# Patient Record
Sex: Female | Born: 1970 | Race: Black or African American | Hispanic: No | Smoking: Former smoker
Health system: Southern US, Community
[De-identification: ages and names within clinical notes are randomized; demographics above are authoritative.]

## PROBLEM LIST (undated history)

## (undated) DIAGNOSIS — M1990 Unspecified osteoarthritis, unspecified site: Secondary | ICD-10-CM

## (undated) DIAGNOSIS — I10 Essential (primary) hypertension: Secondary | ICD-10-CM

## (undated) DIAGNOSIS — U071 COVID-19: Secondary | ICD-10-CM

## (undated) DIAGNOSIS — G8929 Other chronic pain: Secondary | ICD-10-CM

## (undated) HISTORY — PX: ABDOMINAL HYSTERECTOMY: SHX81

---

## 2017-04-26 ENCOUNTER — Encounter (HOSPITAL_BASED_OUTPATIENT_CLINIC_OR_DEPARTMENT_OTHER): Payer: Self-pay | Admitting: Emergency Medicine

## 2017-04-26 ENCOUNTER — Emergency Department (HOSPITAL_BASED_OUTPATIENT_CLINIC_OR_DEPARTMENT_OTHER): Payer: Medicaid Other

## 2017-04-26 ENCOUNTER — Emergency Department (HOSPITAL_BASED_OUTPATIENT_CLINIC_OR_DEPARTMENT_OTHER)
Admission: EM | Admit: 2017-04-26 | Discharge: 2017-04-26 | Disposition: A | Discharge status: Home / Self Care | Payer: Medicaid Other | Attending: Emergency Medicine | Admitting: Emergency Medicine

## 2017-04-26 DIAGNOSIS — F17200 Nicotine dependence, unspecified, uncomplicated: Secondary | ICD-10-CM | POA: Diagnosis not present

## 2017-04-26 DIAGNOSIS — K5909 Other constipation: Secondary | ICD-10-CM | POA: Insufficient documentation

## 2017-04-26 DIAGNOSIS — Z79899 Other long term (current) drug therapy: Secondary | ICD-10-CM | POA: Insufficient documentation

## 2017-04-26 DIAGNOSIS — I10 Essential (primary) hypertension: Secondary | ICD-10-CM | POA: Diagnosis not present

## 2017-04-26 DIAGNOSIS — R109 Unspecified abdominal pain: Secondary | ICD-10-CM | POA: Diagnosis present

## 2017-04-26 HISTORY — DX: Essential (primary) hypertension: I10

## 2017-04-26 LAB — COMPREHENSIVE METABOLIC PANEL
ALT: 20 U/L (ref 14–54)
ANION GAP: 8 (ref 5–15)
AST: 22 U/L (ref 15–41)
Albumin: 3.8 g/dL (ref 3.5–5.0)
Alkaline Phosphatase: 55 U/L (ref 38–126)
BUN: 22 mg/dL — ABNORMAL HIGH (ref 6–20)
CHLORIDE: 98 mmol/L — AB (ref 101–111)
CO2: 30 mmol/L (ref 22–32)
Calcium: 9 mg/dL (ref 8.9–10.3)
Creatinine, Ser: 0.82 mg/dL (ref 0.44–1.00)
Glucose, Bld: 95 mg/dL (ref 65–99)
POTASSIUM: 4.1 mmol/L (ref 3.5–5.1)
Sodium: 136 mmol/L (ref 135–145)
Total Bilirubin: 0.4 mg/dL (ref 0.3–1.2)
Total Protein: 7.1 g/dL (ref 6.5–8.1)

## 2017-04-26 LAB — CBC WITH DIFFERENTIAL/PLATELET
BASOS ABS: 0 10*3/uL (ref 0.0–0.1)
Basophils Relative: 0 %
EOS PCT: 1 %
Eosinophils Absolute: 0 10*3/uL (ref 0.0–0.7)
HCT: 38.4 % (ref 36.0–46.0)
Hemoglobin: 13.1 g/dL (ref 12.0–15.0)
LYMPHS ABS: 1.9 10*3/uL (ref 0.7–4.0)
LYMPHS PCT: 34 %
MCH: 30.8 pg (ref 26.0–34.0)
MCHC: 34.1 g/dL (ref 30.0–36.0)
MCV: 90.4 fL (ref 78.0–100.0)
MONO ABS: 0.4 10*3/uL (ref 0.1–1.0)
Monocytes Relative: 7 %
Neutro Abs: 3.3 10*3/uL (ref 1.7–7.7)
Neutrophils Relative %: 58 %
PLATELETS: 259 10*3/uL (ref 150–400)
RBC: 4.25 MIL/uL (ref 3.87–5.11)
RDW: 13.6 % (ref 11.5–15.5)
WBC: 5.6 10*3/uL (ref 4.0–10.5)

## 2017-04-26 LAB — URINALYSIS, ROUTINE W REFLEX MICROSCOPIC
Bilirubin Urine: NEGATIVE
Glucose, UA: NEGATIVE mg/dL
Ketones, ur: NEGATIVE mg/dL
Leukocytes, UA: NEGATIVE
Nitrite: NEGATIVE
PROTEIN: NEGATIVE mg/dL
Specific Gravity, Urine: 1.008 (ref 1.005–1.030)
pH: 6 (ref 5.0–8.0)

## 2017-04-26 LAB — URINALYSIS, MICROSCOPIC (REFLEX)

## 2017-04-26 MED ORDER — POLYETHYLENE GLYCOL 3350 17 G PO PACK: 17.0000 g | PACK | Freq: Two times a day (BID) | ORAL | 0 refills | Status: DC

## 2017-04-26 MED ORDER — FLEET ENEMA 7-19 GM/118ML RE ENEM
1.0000 | ENEMA | Freq: Once | RECTAL | Status: AC
  Administered 2017-04-26: 1 via RECTAL
  Filled 2017-04-26: qty 1

## 2017-04-26 NOTE — ED Triage Notes (Signed)
LLQ abd pain radiating to the back x 3 days. States she was seen at [facility] yesterday with neg CT and told to f/u with PCP. States PCP cannot see her today and pain continues. Denies urinary symptoms, denies N/V

## 2017-04-26 NOTE — ED Provider Notes (Signed)
[facility] Provider Note   CSN: [id number] Arrival date & time: 04/26/17  1129     History   Chief Complaint Chief Complaint  Patient presents with  . Abdominal Pain  . Back Pain    HPI Theresa Miller is a 46 y.o. female history of hypertension, here presenting with abdominal pain. Patient had him left flank pain and left lower quadrant pain for the last several days. [facility] yesterday and was found to have some hematuria. He also had unremarkable labs and CT renal stone study that showed large constipation with no obvious kidney stone. Patient was thought to have muscle strain and was sent home. She states that her last bowel movement was about a week ago and she had more left upper quadrant pain so she is here for second opinion. She denies any vaginal discharge. Denies any fever or vomiting.       The history is provided by the patient.    Past Medical History:  Diagnosis Date  . Hypertension     There are no active problems to display for this patient.   Past Surgical History:  Procedure Laterality Date  . ABDOMINAL HYSTERECTOMY      OB History    No data available       Home Medications    Prior to Admission medications   Medication Sig Start Date End Date Taking? Authorizing Provider  lisinopril (PRINIVIL,ZESTRIL) 10 MG tablet Take 10 mg by mouth daily.   Yes [provider]  polyethylene glycol (MIRALAX / GLYCOLAX) packet Take 17 g by mouth 2 (two) times daily. 04/26/17   Charlynne PanderYao, Gaylan Fauver Hsienta, MD    Family History No family history on file.  Social History Social History  Substance Use Topics  . Smoking status: Current Every Day Smoker  . Smokeless tobacco: Never Used  . Alcohol use No     Allergies   Patient has no known allergies.   Review of Systems Review of Systems  Gastrointestinal: Positive for abdominal pain.  Musculoskeletal: Positive for back pain.  All other systems reviewed and are  negative.    Physical Exam Updated Vital Signs BP (!) 118/92 (BP Location: Left Arm)   Pulse 75   Temp 97.6 F (36.4 C) (Oral)   Resp 18   Ht 5\' 4"  (1.626 m)   Wt 83.5 kg (184 lb)   SpO2 100%   BMI 31.58 kg/m   Physical Exam  Constitutional: She is oriented to person, place, and time. She appears well-developed.  HENT:  Head: Normocephalic.  Mouth/Throat: Oropharynx is clear and moist.  Eyes: Conjunctivae and EOM are normal. Pupils are equal, round, and reactive to light.  Neck: Normal range of motion. Neck supple.  Cardiovascular: Normal rate, regular rhythm and normal heart sounds.   Pulmonary/Chest: Effort normal and breath sounds normal. No respiratory distress. She has no wheezes.  Abdominal: Soft. Bowel sounds are normal.  Mild LLQ tenderness, no rebound. No obvious CVAT   Musculoskeletal: Normal range of motion. She exhibits no edema.  Neurological: She is alert and oriented to person, place, and time. No cranial nerve deficit. Coordination normal.  Skin: Skin is warm.  Psychiatric: She has a normal mood and affect.  Nursing note and vitals reviewed.    ED Treatments / Results  Labs (all labs ordered are listed, but only abnormal results are displayed) Labs Reviewed  URINALYSIS, ROUTINE W REFLEX MICROSCOPIC - Abnormal; Notable for the following:  Result Value   Color, Urine YELLOW (*)    Hgb urine dipstick SMALL (*)    All other components within normal limits  COMPREHENSIVE METABOLIC PANEL - Abnormal; Notable for the following:    Chloride 98 (*)    BUN 22 (*)    All other components within normal limits  URINALYSIS, MICROSCOPIC (REFLEX) - Abnormal; Notable for the following:    Bacteria, UA RARE (*)    Squamous Epithelial / LPF 0-5 (*)    All other components within normal limits  CBC WITH DIFFERENTIAL/PLATELET    EKG  EKG Interpretation None       Radiology Dg Abd Acute W/chest  Result Date: 04/26/2017 CLINICAL DATA:  Abdominal pain and  low back pain for 3 days. EXAM: DG ABDOMEN ACUTE W/ 1V CHEST COMPARISON:  None. FINDINGS: Frontal view of the chest shows midline trachea and normal heart size. Lungs are clear. No pleural fluid. Two views of the abdomen show stool throughout the colon. No small bowel dilatation. No unexpected radiopaque calculi. Phleboliths are seen in the anatomic pelvis. IMPRESSION: 1. Bowel gas pattern is indicative of constipation. 2. Negative chest. Electronically Signed   By: Leanna Battles M.D.   On: 04/26/2017 12:28    Procedures Procedures (including critical care time)  Medications Ordered in ED Medications  sodium phosphate (FLEET) 7-19 GM/118ML enema 1 enema (1 enema Rectal Given 04/26/17 1254)     Initial Impression / Assessment and Plan / ED Course  I have reviewed the triage vital signs and the nursing notes.  Pertinent labs & imaging results that were available during my care of the patient were reviewed by me and considered in my medical decision making (see chart for details).     Theresa Miller is a 46 y.o. female here with constipation, LLQ pain. CT yesterday showed constipation and patient had no BM for a week. Repeat UA showed some blood but no obvious UTI. Repeat labs unremarkable, xray showed constipation. Given enema and had a large bowel movement and pain improved. Will dc home with miralax for constipation. I doubt torsion or GYN causes of her pain    Final Clinical Impressions(s) / ED Diagnoses   Final diagnoses:  Other constipation    New Prescriptions New Prescriptions   POLYETHYLENE GLYCOL (MIRALAX / GLYCOLAX) PACKET    Take 17 g by mouth 2 (two) times daily.     Charlynne Pander, MD 04/26/17 [phone]

## 2017-04-26 NOTE — Discharge Instructions (Signed)
Take miralax twice daily until your stool is soft for a week.   Stay hydrated,   Take tylenol, motrin for abdominal pain.   See your doctor   Return to ER if you have severe abdominal pain, vomiting, fever

## 2017-04-26 NOTE — ED Notes (Signed)
Pt reports successful BM

## 2019-01-07 ENCOUNTER — Encounter (HOSPITAL_BASED_OUTPATIENT_CLINIC_OR_DEPARTMENT_OTHER): Payer: Self-pay | Admitting: Emergency Medicine

## 2019-01-07 ENCOUNTER — Other Ambulatory Visit: Payer: Self-pay

## 2019-01-07 ENCOUNTER — Emergency Department (HOSPITAL_BASED_OUTPATIENT_CLINIC_OR_DEPARTMENT_OTHER)
Admission: EM | Admit: 2019-01-07 | Discharge: 2019-01-07 | Disposition: A | Discharge status: Home / Self Care | Payer: Medicaid Other | Attending: Emergency Medicine | Admitting: Emergency Medicine

## 2019-01-07 ENCOUNTER — Emergency Department (HOSPITAL_BASED_OUTPATIENT_CLINIC_OR_DEPARTMENT_OTHER): Payer: Medicaid Other

## 2019-01-07 DIAGNOSIS — R110 Nausea: Secondary | ICD-10-CM | POA: Diagnosis not present

## 2019-01-07 DIAGNOSIS — R898 Other abnormal findings in specimens from other organs, systems and tissues: Secondary | ICD-10-CM | POA: Diagnosis not present

## 2019-01-07 DIAGNOSIS — F17200 Nicotine dependence, unspecified, uncomplicated: Secondary | ICD-10-CM | POA: Diagnosis not present

## 2019-01-07 DIAGNOSIS — N951 Menopausal and female climacteric states: Secondary | ICD-10-CM | POA: Insufficient documentation

## 2019-01-07 DIAGNOSIS — I10 Essential (primary) hypertension: Secondary | ICD-10-CM | POA: Diagnosis not present

## 2019-01-07 DIAGNOSIS — Z79899 Other long term (current) drug therapy: Secondary | ICD-10-CM | POA: Diagnosis not present

## 2019-01-07 DIAGNOSIS — R0602 Shortness of breath: Secondary | ICD-10-CM | POA: Diagnosis not present

## 2019-01-07 LAB — CBC WITH DIFFERENTIAL/PLATELET
Abs Immature Granulocytes: 0.04 10*3/uL (ref 0.00–0.07)
BASOS PCT: 0 %
Basophils Absolute: 0 10*3/uL (ref 0.0–0.1)
EOS ABS: 0 10*3/uL (ref 0.0–0.5)
EOS PCT: 1 %
HEMATOCRIT: 42.3 % (ref 36.0–46.0)
Hemoglobin: 13.3 g/dL (ref 12.0–15.0)
IMMATURE GRANULOCYTES: 1 %
LYMPHS ABS: 3 10*3/uL (ref 0.7–4.0)
Lymphocytes Relative: 34 %
MCH: 28.5 pg (ref 26.0–34.0)
MCHC: 31.4 g/dL (ref 30.0–36.0)
MCV: 90.8 fL (ref 80.0–100.0)
MONOS PCT: 8 %
Monocytes Absolute: 0.7 10*3/uL (ref 0.1–1.0)
NEUTROS PCT: 56 %
Neutro Abs: 4.9 10*3/uL (ref 1.7–7.7)
PLATELETS: 328 10*3/uL (ref 150–400)
RBC: 4.66 MIL/uL (ref 3.87–5.11)
RDW: 14 % (ref 11.5–15.5)
WBC: 8.6 10*3/uL (ref 4.0–10.5)
nRBC: 0 % (ref 0.0–0.2)

## 2019-01-07 LAB — BASIC METABOLIC PANEL
ANION GAP: 11 (ref 5–15)
BUN: 24 mg/dL — AB (ref 6–20)
CO2: 24 mmol/L (ref 22–32)
Calcium: 9 mg/dL (ref 8.9–10.3)
Chloride: 101 mmol/L (ref 98–111)
Creatinine, Ser: 0.99 mg/dL (ref 0.44–1.00)
GFR calc Af Amer: >60 mL/min (ref >60)
GLUCOSE: 101 mg/dL — AB (ref 70–99)
POTASSIUM: 3.4 mmol/L — AB (ref 3.5–5.1)
SODIUM: 136 mmol/L (ref 135–145)

## 2019-01-07 LAB — TROPONIN I: Troponin I: <0.03 ng/mL (ref <0.03)

## 2019-01-07 LAB — D-DIMER, QUANTITATIVE: D-Dimer, Quant: <0.27 ug/mL-FEU (ref 0.00–0.50)

## 2019-01-07 NOTE — ED Provider Notes (Signed)
[facility] EMERGENCY DEPARTMENT Provider Note   CSN: [id number] Arrival date & time: 01/07/19  1929    History   Chief Complaint Chief Complaint  Patient presents with  . Abnormal Lab    pt sent by pcp for "abnormal test" - doesn't know what it is  . Shortness of Breath    HPI Theresa Miller is a 48 y.o. female.     The history is provided by the patient and medical records. No language interpreter was used.  Abnormal Lab  Shortness of Breath   Theresa Miller is a 48 y.o. female who presents to the Emergency Department complaining of sob. She presents to the emergency department for evaluation of shortness of breath. She has been short of breath for the last 3 to 4 weeks with significant dyspnea at rest as well as with exertion. She states that she feels like she needs to positive breathe and at times has to put on her cigarette. She has occasional associated nausea. She also complains of occasional hot flashes but she is in menopause. No fevers, cough, chest pain. She did have knee surgery in January of this year and does have some residual swelling in her leg. She has a history of hypertension. No additional medical problems. No hormone use. She has no history of DVT or PE and is not currently anticoagulated. She had an outpatient breathing test performed two weeks ago where she breathed into a device. She was contacted today and told that the test was abnormal and she needs to see her family doctor. She is unsure what the test was or what was abnormal about it. Past Medical History:  Diagnosis Date  . Hypertension     There are no active problems to display for this patient.   Past Surgical History:  Procedure Laterality Date  . ABDOMINAL HYSTERECTOMY       OB History   No obstetric history on file.      Home Medications    Prior to Admission medications   Medication Sig Start Date End Date Taking? Authorizing Provider  lisinopril (PRINIVIL,ZESTRIL) 10  MG tablet Take 10 mg by mouth daily.    [provider]  polyethylene glycol (MIRALAX / GLYCOLAX) packet Take 17 g by mouth 2 (two) times daily. 04/26/17   Charlynne Pander, MD    Family History History reviewed. No pertinent family history.  Social History Social History   Tobacco Use  . Smoking status: Current Every Day Smoker  . Smokeless tobacco: Never Used  Substance Use Topics  . Alcohol use: No  . Drug use: No     Allergies   Patient has no known allergies.   Review of Systems Review of Systems  Respiratory: Positive for shortness of breath.   All other systems reviewed and are negative.    Physical Exam Updated Vital Signs BP 117/88   Pulse 86   Temp 97.8 F (36.6 C) (Oral)   Resp 18   Ht 5\' 4"  (1.626 m)   Wt 102.1 kg   SpO2 99%   BMI 38.62 kg/m   Physical Exam Vitals signs and nursing note reviewed.  Constitutional:      Appearance: She is well-developed.  HENT:     Head: Normocephalic and atraumatic.  Cardiovascular:     Rate and Rhythm: Normal rate and regular rhythm.     Heart sounds: No murmur.  Pulmonary:     Effort: Pulmonary effort is normal. No respiratory distress.  Breath sounds: Normal breath sounds.  Abdominal:     Palpations: Abdomen is soft.     Tenderness: There is no abdominal tenderness. There is no guarding or rebound.  Musculoskeletal:        General: No swelling or tenderness.  Skin:    General: Skin is warm and dry.     Capillary Refill: Capillary refill takes less than 2 seconds.  Neurological:     Mental Status: She is alert and oriented to person, place, and time.  Psychiatric:        Behavior: Behavior normal.      ED Treatments / Results  Labs (all labs ordered are listed, but only abnormal results are displayed) Labs Reviewed  BASIC METABOLIC PANEL - Abnormal; Notable for the following components:      Result Value   Potassium 3.4 (*)    Glucose, Bld 101 (*)    BUN 24 (*)    All other  components within normal limits  TROPONIN I  CBC WITH DIFFERENTIAL/PLATELET  D-DIMER, QUANTITATIVE (NOT AT [facility])    EKG EKG Interpretation  Date/Time:  Saturday January 07 2019 19:44:45 EDT Ventricular Rate:  80 PR Interval:    QRS Duration: 82 QT Interval:  389 QTC Calculation: 449 R Axis:   42 Text Interpretation:  Sinus rhythm Borderline abnrm T, anterolateral leads no prior available for comparison Confirmed by Tilden Fossa [phone]) on 01/07/2019 7:50:46 PM   Radiology Dg Chest 2 View  Result Date: 01/07/2019 CLINICAL DATA:  Shortness of breath for several weeks EXAM: CHEST - 2 VIEW COMPARISON:  08/16/2017 FINDINGS: Cardiac shadows within normal limits. The lungs are well aerated bilaterally. No focal infiltrate or sizable effusion is seen. No bony abnormality is noted. IMPRESSION: No active cardiopulmonary disease. Electronically Signed   By: Alcide Clever M.D.   On: 01/07/2019 20:16    Procedures Procedures (including critical care time)  Medications Ordered in ED Medications - No data to display   Initial Impression / Assessment and Plan / ED Course  I have reviewed the triage vital signs and the nursing notes.  Pertinent labs & imaging results that were available during my care of the patient were reviewed by me and considered in my medical decision making (see chart for details).        Patient with history of tobacco abuse here for evaluation of lab abnormality in three weeks of shortness of breath. It is unclear what lab abnormality she had but feel it is likely it was a pulmonary function test. EKG with nonspecific changes and no priors available for comparison. She has no respiratory distress on examination with clear lungs bilaterally. She has no vital sign abnormalities. Chest x-ray without evidence of pneumonia. Doubt PE, D dimer is negative. Current presentation is not consistent with ACS, CHF, pneumonia. Discussed with patient home care for shortness of  breath. Discussed importance of outpatient follow-up and return precautions.  Final Clinical Impressions(s) / ED Diagnoses   Final diagnoses:  Shortness of breath    ED Discharge Orders    None       Tilden Fossa, MD 01/07/19 2124

## 2019-01-07 NOTE — ED Notes (Signed)
Pt reports having a breathing test at the pain clinic two weeks ago. States she received a call today to follow up with PCP. Unable to report what the test was for.

## 2019-01-07 NOTE — ED Notes (Signed)
ED Provider at bedside. 

## 2019-01-07 NOTE — ED Triage Notes (Signed)
Sent by PCP for an abnormal test that she isn't aware of - states she received a call by doctor to see her PCP on Monday but decided to come in to be seen. Still smoking a pack a day. SHOB for weeks - nothing new about it today.

## 2019-01-07 NOTE — ED Notes (Signed)
PT states understanding of care given, follow up care. PT ambulated from ED to car with a steady gait.  

## 2019-01-14 ENCOUNTER — Emergency Department (HOSPITAL_BASED_OUTPATIENT_CLINIC_OR_DEPARTMENT_OTHER)
Admission: EM | Admit: 2019-01-14 | Discharge: 2019-01-15 | Disposition: A | Discharge status: Home / Self Care | Payer: Medicaid Other | Attending: Emergency Medicine | Admitting: Emergency Medicine

## 2019-01-14 ENCOUNTER — Other Ambulatory Visit: Payer: Self-pay

## 2019-01-14 ENCOUNTER — Emergency Department (HOSPITAL_BASED_OUTPATIENT_CLINIC_OR_DEPARTMENT_OTHER): Payer: Medicaid Other

## 2019-01-14 ENCOUNTER — Encounter (HOSPITAL_BASED_OUTPATIENT_CLINIC_OR_DEPARTMENT_OTHER): Payer: Self-pay | Admitting: *Deleted

## 2019-01-14 DIAGNOSIS — R69 Illness, unspecified: Secondary | ICD-10-CM

## 2019-01-14 DIAGNOSIS — R0602 Shortness of breath: Secondary | ICD-10-CM | POA: Diagnosis present

## 2019-01-14 DIAGNOSIS — I10 Essential (primary) hypertension: Secondary | ICD-10-CM | POA: Diagnosis not present

## 2019-01-14 DIAGNOSIS — F17210 Nicotine dependence, cigarettes, uncomplicated: Secondary | ICD-10-CM | POA: Insufficient documentation

## 2019-01-14 DIAGNOSIS — J111 Influenza due to unidentified influenza virus with other respiratory manifestations: Secondary | ICD-10-CM | POA: Diagnosis not present

## 2019-01-14 DIAGNOSIS — Z79899 Other long term (current) drug therapy: Secondary | ICD-10-CM | POA: Insufficient documentation

## 2019-01-14 HISTORY — DX: Unspecified osteoarthritis, unspecified site: M19.90

## 2019-01-14 LAB — CBC WITH DIFFERENTIAL/PLATELET
Abs Immature Granulocytes: 0.05 10*3/uL (ref 0.00–0.07)
BASOS ABS: 0 10*3/uL (ref 0.0–0.1)
BASOS PCT: 0 %
EOS ABS: 0.1 10*3/uL (ref 0.0–0.5)
EOS PCT: 1 %
HEMATOCRIT: 40.2 % (ref 36.0–46.0)
Hemoglobin: 12.7 g/dL (ref 12.0–15.0)
Immature Granulocytes: 1 %
Lymphocytes Relative: 35 %
Lymphs Abs: 2.7 10*3/uL (ref 0.7–4.0)
MCH: 28.6 pg (ref 26.0–34.0)
MCHC: 31.6 g/dL (ref 30.0–36.0)
MCV: 90.5 fL (ref 80.0–100.0)
MONO ABS: 0.7 10*3/uL (ref 0.1–1.0)
MONOS PCT: 9 %
NEUTROS ABS: 4.3 10*3/uL (ref 1.7–7.7)
NEUTROS PCT: 54 %
PLATELETS: 311 10*3/uL (ref 150–400)
RBC: 4.44 MIL/uL (ref 3.87–5.11)
RDW: 14.4 % (ref 11.5–15.5)
WBC: 7.9 10*3/uL (ref 4.0–10.5)
nRBC: 0 % (ref 0.0–0.2)

## 2019-01-14 LAB — COMPREHENSIVE METABOLIC PANEL
ALT: 32 U/L (ref 0–44)
AST: 29 U/L (ref 15–41)
Albumin: 3.6 g/dL (ref 3.5–5.0)
Alkaline Phosphatase: 74 U/L (ref 38–126)
Anion gap: 9 (ref 5–15)
BUN: 27 mg/dL — AB (ref 6–20)
CHLORIDE: 102 mmol/L (ref 98–111)
CO2: 28 mmol/L (ref 22–32)
CREATININE: 0.77 mg/dL (ref 0.44–1.00)
Calcium: 9.1 mg/dL (ref 8.9–10.3)
GFR calc Af Amer: >60 mL/min (ref >60)
Glucose, Bld: 99 mg/dL (ref 70–99)
POTASSIUM: 3.4 mmol/L — AB (ref 3.5–5.1)
SODIUM: 139 mmol/L (ref 135–145)
Total Bilirubin: 0.2 mg/dL — ABNORMAL LOW (ref 0.3–1.2)
Total Protein: 6.9 g/dL (ref 6.5–8.1)

## 2019-01-14 LAB — TROPONIN I
Troponin I: <0.03 ng/mL (ref <0.03)
Troponin I: <0.03 ng/mL (ref <0.03)

## 2019-01-14 LAB — CBG MONITORING, ED: Glucose-Capillary: 103 mg/dL — ABNORMAL HIGH (ref 70–99)

## 2019-01-14 LAB — D-DIMER, QUANTITATIVE: D-Dimer, Quant: 0.31 ug/mL-FEU (ref 0.00–0.50)

## 2019-01-14 LAB — LIPASE, BLOOD: Lipase: 19 U/L (ref 11–51)

## 2019-01-14 LAB — BRAIN NATRIURETIC PEPTIDE: B NATRIURETIC PEPTIDE 5: 11.8 pg/mL (ref 0.0–100.0)

## 2019-01-14 MED ORDER — ONDANSETRON 4 MG PO TBDP: 4.0000 mg | ORAL_TABLET | Freq: Three times a day (TID) | ORAL | 0 refills | Status: DC | PRN

## 2019-01-14 MED ORDER — SODIUM CHLORIDE 0.9 % IV BOLUS
1000.0000 mL | Freq: Once | INTRAVENOUS | Status: AC
  Administered 2019-01-14: 1000 mL via INTRAVENOUS

## 2019-01-14 MED ORDER — ONDANSETRON HCL 4 MG/2ML IJ SOLN
INTRAMUSCULAR | Status: AC
  Filled 2019-01-14: qty 2

## 2019-01-14 MED ORDER — ONDANSETRON HCL 4 MG/2ML IJ SOLN
4.0000 mg | Freq: Once | INTRAMUSCULAR | Status: AC
  Administered 2019-01-14: 4 mg via INTRAVENOUS

## 2019-01-14 NOTE — ED Notes (Signed)
Ms. Theresa Miller became lightheaded and nauseated as we were walking to discharge, Pulse noted to be fast. Pallor. Placed on pulse ox, noted to be at 120. Pt taken back to room. IV restarted, orhtostatics, EKG completed. FAmily updated.

## 2019-01-14 NOTE — Discharge Instructions (Addendum)
As we discussed earlier you may have coronavirus or another virus however I am not sick enough at this time to require admission into the hospital.  We discussed that the easiest way for me to give you information about quarantine is using the handout below.  You must remain at home in quarantine for a minimum of 7 days.  After that if you have been fever and symptom-free for 3 days you may return to normal activity however I would urge you to limit contact with anyone during this pandemic.      Person Under Monitoring Name: Theresa Miller  Location: [address] [place] [address]   Infection Prevention Recommendations for Individuals Confirmed to have, or Being Evaluated for, 2019 Novel Coronavirus (COVID-19) Infection Who Receive Care at Home  Individuals who are confirmed to have, or are being evaluated for, COVID-19 should follow the prevention steps below until a healthcare provider or local or state health department says they can return to normal activities.  Stay home except to get medical care You should restrict activities outside your home, except for getting medical care. Do not go to work, school, or public areas, and do not use public transportation or taxis.  Call ahead before visiting your doctor Before your medical appointment, call the healthcare provider and tell them that you have, or are being evaluated for, COVID-19 infection. This will help the healthcare providers office take steps to keep other people from getting infected. Ask your healthcare provider to call the local or state health department.  Monitor your symptoms Seek prompt medical attention if your illness is worsening (e.g., difficulty breathing). Before going to your medical appointment, call the healthcare provider and tell them that you have, or are being evaluated for, COVID-19 infection. Ask your healthcare provider to call the local or state health department.  Wear a facemask You  should wear a facemask that covers your nose and mouth when you are in the same room with other people and when you visit a healthcare provider. People who live with or visit you should also wear a facemask while they are in the same room with you.  Separate yourself from other people in your home As much as possible, you should stay in a different room from other people in your home. Also, you should use a separate bathroom, if available.  Avoid sharing household items You should not share dishes, drinking glasses, cups, eating utensils, towels, bedding, or other items with other people in your home. After using these items, you should wash them thoroughly with soap and water.  Cover your coughs and sneezes Cover your mouth and nose with a tissue when you cough or sneeze, or you can cough or sneeze into your sleeve. Throw used tissues in a lined trash can, and immediately wash your hands with soap and water for at least 20 seconds or use an alcohol-based hand rub.  Wash your [employer] your hands often and thoroughly with soap and water for at least 20 seconds. You can use an alcohol-based hand sanitizer if soap and water are not available and if your hands are not visibly dirty. Avoid touching your eyes, nose, and mouth with unwashed hands.   Prevention Steps for Caregivers and Household Members of Individuals Confirmed to have, or Being Evaluated for, COVID-19 Infection Being Cared for in the Home  If you live with, or provide care at home for, a person confirmed to have, or being evaluated for, COVID-19 infection please  follow these guidelines to prevent infection:  Follow healthcare providers instructions Make sure that you understand and can help the patient follow any healthcare provider instructions for all care.  Provide for the patients basic needs You should help the patient with basic needs in the home and provide support for getting groceries, prescriptions, and other  personal needs.  Monitor the patients symptoms If they are getting sicker, call his or her medical provider and tell them that the patient has, or is being evaluated for, COVID-19 infection. This will help the healthcare providers office take steps to keep other people from getting infected. Ask the healthcare provider to call the local or state health department.  Limit the number of people who have contact with the patient If possible, have only one caregiver for the patient. Other household members should stay in another home or place of residence. If this is not possible, they should stay in another room, or be separated from the patient as much as possible. Use a separate bathroom, if available. Restrict visitors who do not have an essential need to be in the home.  Keep older adults, very young children, and other sick people away from the patient Keep older adults, very young children, and those who have compromised immune systems or chronic health conditions away from the patient. This includes people with chronic heart, lung, or kidney conditions, diabetes, and cancer.  Ensure good ventilation Make sure that shared spaces in the home have good air flow, such as from an air conditioner or an opened window, weather permitting.  Wash your hands often Wash your hands often and thoroughly with soap and water for at least 20 seconds. You can use an alcohol based hand sanitizer if soap and water are not available and if your hands are not visibly dirty. Avoid touching your eyes, nose, and mouth with unwashed hands. Use disposable paper towels to dry your hands. If not available, use dedicated cloth towels and replace them when they become wet.  Wear a facemask and gloves Wear a disposable facemask at all times in the room and gloves when you touch or have contact with the patients blood, body fluids, and/or secretions or excretions, such as sweat, saliva, sputum, nasal mucus, vomit,  urine, or feces.  Ensure the mask fits over your nose and mouth tightly, and do not touch it during use. Throw out disposable facemasks and gloves after using them. Do not reuse. Wash your hands immediately after removing your facemask and gloves. If your personal clothing becomes contaminated, carefully remove clothing and launder. Wash your hands after handling contaminated clothing. Place all used disposable facemasks, gloves, and other waste in a lined container before disposing them with other household waste. Remove gloves and wash your hands immediately after handling these items.  Do not share dishes, glasses, or other household items with the patient Avoid sharing household items. You should not share dishes, drinking glasses, cups, eating utensils, towels, bedding, or other items with a patient who is confirmed to have, or being evaluated for, COVID-19 infection. After the person uses these items, you should wash them thoroughly with soap and water.  Wash laundry thoroughly Immediately remove and wash clothes or bedding that have blood, body fluids, and/or secretions or excretions, such as sweat, saliva, sputum, nasal mucus, vomit, urine, or feces, on them. Wear gloves when handling laundry from the patient. Read and follow directions on labels of laundry or clothing items and detergent. In general, wash and dry with the  warmest temperatures recommended on the label.  Clean all areas the individual has used often Clean all touchable surfaces, such as counters, tabletops, doorknobs, bathroom fixtures, toilets, phones, keyboards, tablets, and bedside tables, every day. Also, clean any surfaces that may have blood, body fluids, and/or secretions or excretions on them. Wear gloves when cleaning surfaces the patient has come in contact with. Use a diluted bleach solution (e.g., dilute bleach with 1 part bleach and 10 parts water) or a household disinfectant with a label that says  EPA-registered for coronaviruses. To make a bleach solution at home, add 1 tablespoon of bleach to 1 quart (4 cups) of water. For a larger supply, add  cup of bleach to 1 gallon (16 cups) of water. Read labels of cleaning products and follow recommendations provided on product labels. Labels contain instructions for safe and effective use of the cleaning product including precautions you should take when applying the product, such as wearing gloves or eye protection and making sure you have good ventilation during use of the product. Remove gloves and wash hands immediately after cleaning.  Monitor yourself for signs and symptoms of illness Caregivers and household members are considered close contacts, should monitor their health, and will be asked to limit movement outside of the home to the extent possible. Follow the monitoring steps for close contacts listed on the symptom monitoring form.   ? If you have additional questions, contact your local health department or call the epidemiologist on call at [phone] (available 24/7). ? This guidance is subject to change. For the most up-to-date guidance from [facility], please refer to their website: [email or web address]

## 2019-01-14 NOTE — ED Provider Notes (Signed)
[facility] EMERGENCY DEPARTMENT Provider Note   CSN: [id number] Arrival date & time: 01/14/19  1856    History   Chief Complaint Chief Complaint  Patient presents with  . Nausea  . Fatigue    HPI Theresa Miller is a 48 y.o. female with a past medical history of hypertension, arthritis, who presents today for evaluation of multiple complaints.  She reports that last night when she went to bed she was feeling okay, however today since 6:30 AM when she woke up she has had fatigue, generally feeling weak, nausea, and sweating.  She reports that she has been short of breath.  She was seen on 3/21 for shortness of breath, says that she followed up with a pulmonologist and had normal lung function then.  She reports that shortly prior to arrival she took Theraflu.  She has not checked her temperature at home however reports that she has been sweating.    She denies any recent travel history, has not had any exposure to to anyone known to have coronavirus or similar symptoms.  He denies chest pain or abdominal pain.  She does report that she smokes.   She did have surgery on her right leg on 11/17/2018 and since then has had calf pain.  She denies any changes to this.     HPI  Past Medical History:  Diagnosis Date  . Arthritis   . Hypertension     There are no active problems to display for this patient.   Past Surgical History:  Procedure Laterality Date  . ABDOMINAL HYSTERECTOMY       OB History   No obstetric history on file.      Home Medications    Prior to Admission medications   Medication Sig Start Date End Date Taking? Authorizing Provider  lisinopril (PRINIVIL,ZESTRIL) 10 MG tablet Take 10 mg by mouth daily.    [provider]  ondansetron (ZOFRAN ODT) 4 MG disintegrating tablet Take 1 tablet (4 mg total) by mouth every 8 (eight) hours as needed for nausea or vomiting. 01/14/19   Cristina Gong, PA-C  polyethylene glycol [facility] /  Ethelene Hal) packet Take 17 g by mouth 2 (two) times daily. 04/26/17   Charlynne Pander, MD    Family History No family history on file.  Social History Social History   Tobacco Use  . Smoking status: Current Every Day Smoker    Types: Cigarettes  . Smokeless tobacco: Never Used  Substance Use Topics  . Alcohol use: Yes    Comment: occasional  . Drug use: No     Allergies   Patient has no known allergies.   Review of Systems Review of Systems  Constitutional: Positive for fatigue.  HENT: Negative for congestion and sore throat.   Respiratory: Positive for shortness of breath. Negative for cough, chest tightness, wheezing and stridor.   Cardiovascular: Positive for leg swelling. Negative for chest pain and palpitations.  Gastrointestinal: Positive for nausea. Negative for abdominal pain, diarrhea and vomiting.  Genitourinary: Negative for dysuria.  Musculoskeletal: Positive for arthralgias and myalgias. Negative for neck pain and neck stiffness.  Skin: Negative for pallor and rash.  Neurological: Positive for light-headedness (When standing). Negative for weakness.  All other systems reviewed and are negative.    Physical Exam Updated Vital Signs BP 132/90   Pulse 85   Temp 98.1 F (36.7 C) (Oral)   Resp 14   Ht 5\' 4"  (1.626 m)   Wt 102.1 kg  SpO2 97%   BMI 38.62 kg/m   Physical Exam Vitals signs and nursing note reviewed.  Constitutional:      General: She is not in acute distress.    Appearance: She is well-developed.  HENT:     Head: Normocephalic and atraumatic.  Eyes:     Conjunctiva/sclera: Conjunctivae normal.  Neck:     Musculoskeletal: Normal range of motion and neck supple. No neck rigidity.  Cardiovascular:     Rate and Rhythm: Regular rhythm. Tachycardia present.     Pulses: Normal pulses.     Heart sounds: Normal heart sounds. No murmur.  Pulmonary:     Effort: Tachypnea present. No respiratory distress.     Breath sounds: Normal breath  sounds. No stridor. No wheezing, rhonchi or rales.  Chest:     Chest wall: No tenderness.  Abdominal:     General: There is no distension.     Palpations: Abdomen is soft.     Tenderness: There is no abdominal tenderness.  Musculoskeletal:     Right lower leg: No edema.     Left lower leg: No edema.  Skin:    General: Skin is warm and dry.  Neurological:     General: No focal deficit present.     Mental Status: She is alert and oriented to person, place, and time.  Psychiatric:        Mood and Affect: Mood normal.        Behavior: Behavior normal.      ED Treatments / Results  Labs (all labs ordered are listed, but only abnormal results are displayed) Labs Reviewed  COMPREHENSIVE METABOLIC PANEL - Abnormal; Notable for the following components:      Result Value   Potassium 3.4 (*)    BUN 27 (*)    Total Bilirubin 0.2 (*)    All other components within normal limits  CBG MONITORING, ED - Abnormal; Notable for the following components:   Glucose-Capillary 103 (*)    All other components within normal limits  LIPASE, BLOOD  CBC WITH DIFFERENTIAL/PLATELET  TROPONIN I  BRAIN NATRIURETIC PEPTIDE  D-DIMER, QUANTITATIVE (NOT AT [facility])    EKG EKG Interpretation  Date/Time:  Saturday January 14 2019 19:35:25 EDT Ventricular Rate:  98 PR Interval:    QRS Duration: 74 QT Interval:  302 QTC Calculation: 386 R Axis:   17 Text Interpretation:  Sinus rhythm Low voltage, precordial leads Borderline T abnormalities, anterior leads Confirmed by Kennis Carina [phone]) on 01/14/2019 9:20:50 PM  EKG Interpretation  Date/Time:  Saturday January 14 2019 19:35:25 EDT Ventricular Rate:  98 PR Interval:    QRS Duration: 74 QT Interval:  302 QTC Calculation: 386 R Axis:   17 Text Interpretation:  Sinus rhythm Low voltage, precordial leads Borderline T abnormalities, anterior leads Confirmed by Kennis Carina [phone]) on 01/14/2019 9:20:50 PM        Radiology Dg Chest Port 1 View   Result Date: 01/14/2019 CLINICAL DATA:  Nausea, shortness of breath, chills EXAM: PORTABLE CHEST 1 VIEW COMPARISON:  01/07/2019 FINDINGS: The heart size and mediastinal contours are within normal limits. Both lungs are clear. The visualized skeletal structures are unremarkable. IMPRESSION: No acute abnormality of the lungs in AP portable projection. Electronically Signed   By: Lauralyn Primes M.D.   On: 01/14/2019 20:13    Procedures Procedures (including critical care time)  Medications Ordered in ED Medications  ondansetron (ZOFRAN) 4 MG/2ML injection (has no administration in time range)  sodium chloride  0.9 % bolus 1,000 mL (has no administration in time range)  ondansetron (ZOFRAN) injection 4 mg (4 mg Intravenous Given 01/14/19 2017)   Orthostatic VS for the past 24 hrs:  BP- Lying Pulse- Lying BP- Sitting Pulse- Sitting BP- Standing at 0 minutes Pulse- Standing at 0 minutes  01/14/19 2230 - - (!) 127/98 - (!) 134/98 105  01/14/19 2228 124/89 89 - 107 - -       Initial Impression / Assessment and Plan / ED Course  I have reviewed the triage vital signs and the nursing notes.  Pertinent labs & imaging results that were available during my care of the patient were reviewed by me and considered in my medical decision making (see chart for details).  Clinical Course as of Jan 14 2323  Sat Jan 14, 2019  2112 Was observed through the window of the door.  She is talking on the phone in full sentences with no obvious distress.  No longer appears tachypneic.   [EH]  2135 Patient updated.    [EH]  2217 While patient was walking out after being discharged she started to feel short of breath and like she was going to pass out.  She was brought back into the room.  She is short of breath again and tachycardic with heart rates into the 120s.  Ordered IV fluids, p.o. fluids and sugar in addition to CBG.   [EH]  2220 Patient again appears short of breath.  Her oxygen sat is at 100% however she  is tachypneic.  Will also order CT angios PE study.   [EH]  2226 Spoke with CT tech.  They report that if patient is scanned here than the room will have to be closed for 70 minutes and they will be unable to perform any other CT scans, even if something emergent comes in such as a trauma or a stroke.   [EH]  2313 Patient reevaluated, she is gotten approximately half of her liter of fluids.   [EH]    Clinical Course User Index [EH] Cristina Gong, PA-C      Patient presents today for evaluation of generally feeling weak, nausea, sweating, and shortness of breath.  She does not have any known exposures to anyone with coronavirus however she has had sweats at home and subjective fevers.  EKG was obtained without evidence of acute abnormality.  Troponin x2 is normal.  Portable chest x-ray was ordered to limit exposures which did not show acute abnormality.  D-dimer was obtained which was negative.  Lipase and CMP are normal.  She does not have significant leukocytosis or anemia.  Patient reported feeling better and was able to walk around in the room without difficulties.  Patient was walking out of the department when she had a near syncopal event.  She was brought back into the room and repeat EKG was obtained.  Her second troponin was not elevated.  Orthostatics were obtained showing that she gets tachycardic with position changes.  Suspect that she is dehydrated secondary to viral illness and nausea.  She is given p.o. fluids and IV fluids.    At shift change care was transferred to Dr. Read Drivers who will follow pending studies, re-evaulate and determine disposition.    Lailanee Arends was evaluated in Emergency Department on 01/14/2019 for the symptoms described in the history of present illness. She was evaluated in the context of the global COVID-19 pandemic, which necessitated consideration that the patient might be at risk for infection  with the SARS-CoV-2 virus that causes COVID-19.  Institutional protocols and algorithms that pertain to the evaluation of patients at risk for COVID-19 are in a state of rapid change based on information released by regulatory bodies including the CDC and federal and state organizations. These policies and algorithms were followed during the patient's care in the ED.    Final Clinical Impressions(s) / ED Diagnoses   Final diagnoses:  Influenza-like illness  Shortness of breath    ED Discharge Orders         Ordered    ondansetron (ZOFRAN ODT) 4 MG disintegrating tablet  Every 8 hours PRN     01/14/19 2133           Cristina Gong, PA-C 01/14/19 2335    Sabas Sous, MD 01/15/19 1452

## 2019-01-14 NOTE — ED Notes (Signed)
Pt walked around without distress in the room. She was able to comfortably lie flat.

## 2019-01-14 NOTE — ED Triage Notes (Signed)
Pt reports she woke this am with nausea, fatigue, SOB, chills

## 2019-01-15 NOTE — ED Notes (Signed)
Pt reports feeling better after IV fluid bolus. Wants to go home. Provided phone to call ride. Dressing at this time

## 2019-01-26 ENCOUNTER — Emergency Department (HOSPITAL_BASED_OUTPATIENT_CLINIC_OR_DEPARTMENT_OTHER)
Admission: EM | Admit: 2019-01-26 | Discharge: 2019-01-27 | Disposition: A | Discharge status: Home / Self Care | Payer: Medicaid Other | Attending: Emergency Medicine | Admitting: Emergency Medicine

## 2019-01-26 ENCOUNTER — Encounter (HOSPITAL_BASED_OUTPATIENT_CLINIC_OR_DEPARTMENT_OTHER): Payer: Self-pay | Admitting: *Deleted

## 2019-01-26 ENCOUNTER — Other Ambulatory Visit: Payer: Self-pay

## 2019-01-26 DIAGNOSIS — F17210 Nicotine dependence, cigarettes, uncomplicated: Secondary | ICD-10-CM | POA: Diagnosis not present

## 2019-01-26 DIAGNOSIS — I10 Essential (primary) hypertension: Secondary | ICD-10-CM | POA: Diagnosis not present

## 2019-01-26 DIAGNOSIS — R42 Dizziness and giddiness: Secondary | ICD-10-CM | POA: Diagnosis present

## 2019-01-26 DIAGNOSIS — R531 Weakness: Secondary | ICD-10-CM

## 2019-01-26 DIAGNOSIS — Z79899 Other long term (current) drug therapy: Secondary | ICD-10-CM | POA: Insufficient documentation

## 2019-01-26 LAB — URINALYSIS, MICROSCOPIC (REFLEX)

## 2019-01-26 LAB — URINALYSIS, ROUTINE W REFLEX MICROSCOPIC
Bilirubin Urine: NEGATIVE
Glucose, UA: NEGATIVE mg/dL
Ketones, ur: NEGATIVE mg/dL
Leukocytes,Ua: NEGATIVE
Nitrite: NEGATIVE
Protein, ur: NEGATIVE mg/dL
Specific Gravity, Urine: 1.02 (ref 1.005–1.030)
pH: 6 (ref 5.0–8.0)

## 2019-01-26 MED ORDER — ONDANSETRON 4 MG PO TBDP
4.0000 mg | ORAL_TABLET | Freq: Once | ORAL | Status: AC
  Administered 2019-01-27: 4 mg via ORAL
  Filled 2019-01-26: qty 1

## 2019-01-26 MED ORDER — METRONIDAZOLE 500 MG PO TABS
500.0000 mg | ORAL_TABLET | Freq: Once | ORAL | Status: AC
  Administered 2019-01-27: 500 mg via ORAL
  Filled 2019-01-26: qty 1

## 2019-01-26 MED ORDER — ONDANSETRON 4 MG PO TBDP: 4.0000 mg | ORAL_TABLET | Freq: Four times a day (QID) | ORAL | 0 refills | Status: DC | PRN

## 2019-01-26 MED ORDER — METRONIDAZOLE 500 MG PO TABS: 500.0000 mg | ORAL_TABLET | Freq: Two times a day (BID) | ORAL | 0 refills | Status: DC

## 2019-01-26 NOTE — ED Provider Notes (Signed)
TIME SEEN: 11:18 PM  CHIEF COMPLAINT: Multiple complaints  HPI: Patient is a 48 year old female with history of hypertension, chronic pain on a pain management contract who presents to the emergency department with multiple complaints.  She states that for 2 weeks she has felt lightheaded, fatigued and has had generalized weakness.  She has been seen in the emergency department March 21, March 28 and by pulmonology on March 31.  Each time she is requested novel coronavirus testing.  She told nursing staff today that she is here for COVID-19 testing and "refuses to leave until I get it".  She denies to me any fevers, chills, chest pain, shortness of breath, cough.  She states she has had some nausea but no vomiting or diarrhea.  No abdominal pain.  Also reports 3 weeks of a white vaginal discharge that is similar to when she has had bacterial vaginosis.  She is sexually active with one female partner and states the last time she was sexually active was 1 month ago.  She has had a hysterectomy.  No vaginal bleeding.  She reports she has had some dysuria but no urinary frequency, urgency or hematuria.  She denies any numbness or tingling.  No focal weakness.  States she just feels weak all over and very tired.  She tells me she thinks she is septic based on what she looked up on the Internet.  She states "I think there is a bacteria or virus taking over my body".  She denies any sick contacts.  Denies any recent travel.   ROS: See HPI Constitutional: no fever  Eyes: no drainage  ENT: no runny nose   Cardiovascular:  no chest pain  Resp: no SOB  GI: no vomiting GU:  dysuria Integumentary: no rash  Allergy: no hives  Musculoskeletal: no leg swelling  Neurological: no slurred speech ROS otherwise negative  PAST MEDICAL HISTORY/PAST SURGICAL HISTORY:  Past Medical History:  Diagnosis Date  . Arthritis   . Hypertension     MEDICATIONS:  Prior to Admission medications   Medication Sig Start Date  End Date Taking? Authorizing Provider  allopurinol (ZYLOPRIM) 100 MG tablet Take 100 mg by mouth daily.   Yes [provider]  cetirizine (ZYRTEC) 10 MG tablet Take 10 mg by mouth daily.   Yes [provider]  cyclobenzaprine (FLEXERIL) 10 MG tablet Take 10 mg by mouth 3 (three) times daily as needed for muscle spasms.   Yes [provider]  diclofenac (VOLTAREN) 75 MG EC tablet Take 75 mg by mouth 2 (two) times daily.   Yes [provider]  esomeprazole (NEXIUM) 40 MG capsule Take 40 mg by mouth daily at 12 noon.   Yes [provider]  fluticasone (FLONASE) 50 MCG/ACT nasal spray Place 2 sprays into both nostrils daily.   Yes [provider]  hydrochlorothiazide (HYDRODIURIL) 25 MG tablet Take 25 mg by mouth daily.   Yes [provider]  meloxicam (MOBIC) 15 MG tablet Take 15 mg by mouth daily.   Yes [provider]  oxycodone (OXY-IR) 5 MG capsule Take 5 mg by mouth 3 (three) times daily as needed.   Yes [provider]  valsartan (DIOVAN) 80 MG tablet Take 80 mg by mouth daily.   Yes [provider]    ALLERGIES:  No Known Allergies  SOCIAL HISTORY:  Social History   Tobacco Use  . Smoking status: Current Every Day Smoker    Types: Cigarettes  . Smokeless tobacco:  Never Used  Substance Use Topics  . Alcohol use: Yes    Comment: occasional    FAMILY HISTORY: History reviewed. No pertinent family history.  EXAM: BP (!) 149/100   Pulse 98   Temp 97.9 F (36.6 C)   Resp 18   SpO2 100%  CONSTITUTIONAL: Alert and responds appropriately to questions. Well-appearing; well-nourished, in no distress, afebrile, appears well-hydrated HEAD: Normocephalic, atraumatic EYES: Conjunctivae clear ENT: normal nose; moist mucous membranes NECK: Normal range of motion CARD: Regular rate and rhythm RESP: Normal chest excursion without splinting or tachypnea; no hypoxia or respiratory distress, speaking  full sentences ABD/GI: non-distended EXT: Normal ROM in all joints, no major deformities noted SKIN: Normal color for age and race, no rashes on exposed skin NEURO: Moves all extremities equally, no drift, normal speech, no facial asymmetry noted, ambulated to room with quick and steady gait without any assistance PSYCH: The patient's mood and manner are appropriate. Grooming and personal hygiene are appropriate.  MEDICAL DECISION MAKING: Patient here with multiple vague complaints.  She has been here twice before for the same and has had 2 extensive negative work-ups including normal CBC, CMP, negative troponin, negative d-dimer, normal EKG and chest x-rays.  She was also seen by pulmonology requesting novel coronavirus testing on March 31.  It has been explained the patient multiple times at unfortunately at this time testing is reserved for critically ill, hospitalized patients.  I do not feel again that patient meets criteria for testing from the emergency department or does she need admission to the hospital.  I do not feel she needs repeat work-up with labs today given she has had 2 extensive work-ups that have been unremarkable.  She does complain of dysuria and therefore we will send a urinalysis as this has not been done during her last 2 visits.  She also complains of feeling like she has bacterial vaginosis.  No abdominal pain and no vaginal bleeding.  Will discharge with metronidazole prescription but given patient has concerns for possible coronavirus, I do not feel that healthcare staff needs to be exposed for a routine pelvic exam but can be done as an outpatient when she has improved.  Nothing at this time that would suggest ACS, PE, dissection, sepsis, stroke.  She does not appear dehydrated and states she is eating and drinking without vomiting or diarrhea.  I have explained to patient why I do not feel this is sepsis given she is well-appearing today with normal vital signs.  We have had a  lengthy discussion that she will need to self quarantine until she is asymptomatic given she is concerned that she has coronavirus.  She currently does not have any clinical signs of shortness of breath with no increased work of breathing, hypoxia.  She is speaking full sentences without difficulty and was able to ambulate back to her room without any signs of shortness of breath.  I think that anxiety is playing a significant role in patient's symptoms today.  ED PROGRESS: Patient's urine does not appear infected.  She does have many bacteria but also multiple squamous cells but no other sign of infection.  No ketones to suggest dehydration.  She does not describe her dizziness as a vertiginous symptom but more feeling lightheaded and tired.  She is requesting medication for the fatigue.  Explained to patient that this may be a symptom of possible viral infection and that it will take time to improve and that unfortunate there are no medications that  would help with fatigue.  She will be provided with prescriptions for Zofran and Flagyl as well as outpatient follow-up.    At this time, I do not feel there is any life-threatening condition present. I have reviewed and discussed all results (EKG, imaging, lab, urine as appropriate) and exam findings with patient/family. I have reviewed nursing notes and appropriate previous records.  I feel the patient is safe to be discharged home without further emergent workup and can continue workup as an outpatient as needed. Discussed usual and customary return precautions. Patient/family verbalize understanding with this plan.  Outpatient follow-up has been provided as needed. All questions have been answered.    In light of the current and rapidly developing COVID-19 pandemic, I have attempted to carefully consider the risks and benefits of prolonged ED workups and/or hospitalization versus patient's risk of acquiring or transmitting SARS-CoV-2.  I have made  reasonable efforts to conserve healthcare resources and defer to safe outpatient alternatives when practical.  I have also discussed the importance of social distancing and proper hygiene to the patient and/or caretakers.    Trentan Trippe, Layla Maw, DO 01/27/19 0007

## 2019-01-26 NOTE — ED Triage Notes (Signed)
Pt c/o "pain" and wants to be rechecked for Covid 19

## 2019-01-26 NOTE — ED Notes (Signed)
ED Provider at bedside. 

## 2019-01-26 NOTE — Discharge Instructions (Addendum)
Your urine today shows no sign of infection or dehydration.  You have had 2 very significant work-ups in the emergency department that have shown normal labs including normal blood counts, electrolytes, renal function, liver function tests, cardiac labs, testing to rule out blood clots, normal EKGs and clear chest x-rays.  You need to quarantine yourself for at least 7 days after the onset of symptoms and if you have a fever, you need to be fever free for at least 3 days.  We do not do routine testing for novel coronavirus from the emergency department.  We have provided you with outpatient follow-up.   [place] [facility]:  [facility] [employer] [facility] [employer] [address] [place], [place] [phone]  Center for [facility] [address] [place], [place] [phone]  [facility] [address] [place]  [address] [place], [place] [phone]   [facility] [address] [place] [address] [place], [place] [phone]  [facility] [address] [place], [place] [phone]   [facility] [address] [place] [address] [place], [place] [phone]   [facility] [address] [place], [place] [phone]   [facility] [address] [place], [place] [phone]   [facility] [address] [place], [place] [phone]     Your presentation today, vital signs do not suggest sepsis.   We are treating your vaginal discharge with metronidazole for bacterial vaginosis.  I recommend that you follow-up with your OB/GYN once your other symptoms have resolved.  Since you were concerned about coronavirus, you need to self quarantine for at least 1 week after onset of symptoms for 3 days until fevers have resolved.

## 2019-06-09 ENCOUNTER — Emergency Department (HOSPITAL_BASED_OUTPATIENT_CLINIC_OR_DEPARTMENT_OTHER)
Admission: EM | Admit: 2019-06-09 | Discharge: 2019-06-09 | Disposition: A | Discharge status: Home / Self Care | Payer: Medicaid Other | Source: Home / Self Care

## 2019-06-09 ENCOUNTER — Other Ambulatory Visit: Payer: Self-pay

## 2019-06-09 ENCOUNTER — Encounter (HOSPITAL_BASED_OUTPATIENT_CLINIC_OR_DEPARTMENT_OTHER): Payer: Self-pay | Admitting: Emergency Medicine

## 2019-06-09 ENCOUNTER — Emergency Department (HOSPITAL_BASED_OUTPATIENT_CLINIC_OR_DEPARTMENT_OTHER)
Admission: EM | Admit: 2019-06-09 | Discharge: 2019-06-09 | Disposition: A | Discharge status: Home / Self Care | Payer: Medicaid Other | Attending: Emergency Medicine | Admitting: Emergency Medicine

## 2019-06-09 DIAGNOSIS — R531 Weakness: Secondary | ICD-10-CM | POA: Insufficient documentation

## 2019-06-09 DIAGNOSIS — Z5321 Procedure and treatment not carried out due to patient leaving prior to being seen by health care provider: Secondary | ICD-10-CM | POA: Insufficient documentation

## 2019-06-09 DIAGNOSIS — R5383 Other fatigue: Secondary | ICD-10-CM | POA: Insufficient documentation

## 2019-06-09 HISTORY — DX: COVID-19: U07.1

## 2019-06-09 LAB — CBC WITH DIFFERENTIAL/PLATELET
Abs Immature Granulocytes: 0.04 10*3/uL (ref 0.00–0.07)
Basophils Absolute: 0 10*3/uL (ref 0.0–0.1)
Basophils Relative: 0 %
Eosinophils Absolute: 0 10*3/uL (ref 0.0–0.5)
Eosinophils Relative: 0 %
HCT: 42.7 % (ref 36.0–46.0)
Hemoglobin: 13.8 g/dL (ref 12.0–15.0)
Immature Granulocytes: 1 %
Lymphocytes Relative: 24 %
Lymphs Abs: 1.9 10*3/uL (ref 0.7–4.0)
MCH: 28.6 pg (ref 26.0–34.0)
MCHC: 32.3 g/dL (ref 30.0–36.0)
MCV: 88.4 fL (ref 80.0–100.0)
Monocytes Absolute: 0.4 10*3/uL (ref 0.1–1.0)
Monocytes Relative: 5 %
Neutro Abs: 5.5 10*3/uL (ref 1.7–7.7)
Neutrophils Relative %: 70 %
Platelets: 342 10*3/uL (ref 150–400)
RBC: 4.83 MIL/uL (ref 3.87–5.11)
RDW: 14.8 % (ref 11.5–15.5)
WBC: 7.9 10*3/uL (ref 4.0–10.5)
nRBC: 0 % (ref 0.0–0.2)

## 2019-06-09 LAB — URINALYSIS, ROUTINE W REFLEX MICROSCOPIC
Bilirubin Urine: NEGATIVE
Glucose, UA: NEGATIVE mg/dL
Ketones, ur: NEGATIVE mg/dL
Leukocytes,Ua: NEGATIVE
Nitrite: NEGATIVE
Protein, ur: NEGATIVE mg/dL
Specific Gravity, Urine: <1.005 — ABNORMAL LOW (ref 1.005–1.030)
pH: 6 (ref 5.0–8.0)

## 2019-06-09 LAB — URINALYSIS, MICROSCOPIC (REFLEX): WBC, UA: NONE SEEN WBC/hpf (ref 0–5)

## 2019-06-09 LAB — BASIC METABOLIC PANEL
Anion gap: 12 (ref 5–15)
BUN: 22 mg/dL — ABNORMAL HIGH (ref 6–20)
CO2: 24 mmol/L (ref 22–32)
Calcium: 9.3 mg/dL (ref 8.9–10.3)
Chloride: 101 mmol/L (ref 98–111)
Creatinine, Ser: 0.83 mg/dL (ref 0.44–1.00)
GFR calc Af Amer: >60 mL/min (ref >60)
GFR calc non Af Amer: >60 mL/min (ref >60)
Glucose, Bld: 103 mg/dL — ABNORMAL HIGH (ref 70–99)
Potassium: 3.5 mmol/L (ref 3.5–5.1)
Sodium: 137 mmol/L (ref 135–145)

## 2019-06-09 NOTE — ED Triage Notes (Signed)
  Pt was here earlier but had to leave and take a family member somewhere and now she is back and checked in again.  Copied from previous chart, as complaint is the same: "Pt seen yesterday by rheumatologist for possible Lupis but pt sts they told her she didn't have it.  Today pt is weak and sts "There is something wrong in my body."  Sts she had Covid in March and this is not what it felt like.  Sts she was Covid tested 1 week ago and they did not call her so she is assuming that was negative. Some nausea but no vomiting or diarrhea." Pt has been tested for Covid monthly since having Covid in march with negative results.

## 2019-06-09 NOTE — ED Triage Notes (Signed)
Pt seen yesterday by rheumatologist for possible Lupis but pt sts they told her she didn't have it.  Today pt is weak and sts "There is something wrong in my body."  Sts she had Covid in March and this is not what it felt like.  Sts she was Covid tested 1 week ago and they did not call her so she is assuming that was negative. Some nausea but no vomiting or diarrhea.

## 2019-06-10 ENCOUNTER — Emergency Department (HOSPITAL_BASED_OUTPATIENT_CLINIC_OR_DEPARTMENT_OTHER)
Admission: EM | Admit: 2019-06-10 | Discharge: 2019-06-10 | Disposition: A | Discharge status: Home / Self Care | Payer: Medicaid Other | Attending: Emergency Medicine | Admitting: Emergency Medicine

## 2019-06-10 ENCOUNTER — Other Ambulatory Visit: Payer: Self-pay

## 2019-06-10 ENCOUNTER — Encounter (HOSPITAL_BASED_OUTPATIENT_CLINIC_OR_DEPARTMENT_OTHER): Payer: Self-pay | Admitting: Emergency Medicine

## 2019-06-10 ENCOUNTER — Emergency Department (HOSPITAL_BASED_OUTPATIENT_CLINIC_OR_DEPARTMENT_OTHER): Payer: Medicaid Other

## 2019-06-10 DIAGNOSIS — F17210 Nicotine dependence, cigarettes, uncomplicated: Secondary | ICD-10-CM | POA: Insufficient documentation

## 2019-06-10 DIAGNOSIS — Z79899 Other long term (current) drug therapy: Secondary | ICD-10-CM | POA: Diagnosis not present

## 2019-06-10 DIAGNOSIS — Z20828 Contact with and (suspected) exposure to other viral communicable diseases: Secondary | ICD-10-CM | POA: Diagnosis not present

## 2019-06-10 DIAGNOSIS — R110 Nausea: Secondary | ICD-10-CM | POA: Insufficient documentation

## 2019-06-10 DIAGNOSIS — I10 Essential (primary) hypertension: Secondary | ICD-10-CM | POA: Insufficient documentation

## 2019-06-10 DIAGNOSIS — M791 Myalgia, unspecified site: Secondary | ICD-10-CM

## 2019-06-10 DIAGNOSIS — R5383 Other fatigue: Secondary | ICD-10-CM | POA: Diagnosis not present

## 2019-06-10 DIAGNOSIS — M7918 Myalgia, other site: Secondary | ICD-10-CM | POA: Diagnosis not present

## 2019-06-10 HISTORY — DX: Other chronic pain: G89.29

## 2019-06-10 NOTE — ED Notes (Signed)
XR at bedside for CXR.

## 2019-06-10 NOTE — Discharge Instructions (Addendum)
You were seen in the emergency department for fatigue and body aches.  He did not have any fever here and yesterday had lab work and a urinalysis that were unremarkable.  We did a chest x-ray today that did not show any obvious pneumonia.  You should continue to use Tylenol and ibuprofen for pain.  Please drink plenty of fluids.  Please watch for any further symptoms.  We also sent out a Covid test that should result in the next few days.  You should isolate until the results of your test are back.

## 2019-06-10 NOTE — ED Triage Notes (Signed)
Fatigued since yesterday and body aches.

## 2019-06-10 NOTE — ED Provider Notes (Signed)
[facility] EMERGENCY DEPARTMENT Provider Note   CSN: [id number] Arrival date & time: 06/10/19  [id number]     History   Chief Complaint Chief Complaint  Patient presents with  . Fatigue    HPI Theresa Miller is a 48 y.o. female.  She is complaining of feeling fatigued that started yesterday.  She came to the ED yesterday and had lab work done but did not end up being seen because the wait was so long.  She said she has had a little bit of nausea and some achiness in her thighs.  No fever no cough.  Little bit of a sore throat.  No abdominal pain vomiting.  One episode of loose stool.  She said she had corona in March and it did not feel like this.  No sick contacts or recent travel.     The history is provided by the patient.  Illness Quality:  Dont feel good Severity:  Moderate Onset quality:  Sudden Duration:  2 days Timing:  Constant Progression:  Unchanged Chronicity:  New Relieved by:  Theraflu Associated symptoms: diarrhea, fatigue, myalgias, nausea and sore throat   Associated symptoms: no abdominal pain, no chest pain, no congestion, no cough, no fever, no headaches, no loss of consciousness, no rash, no rhinorrhea, no shortness of breath and no vomiting     Past Medical History:  Diagnosis Date  . Arthritis   . COVID-19   . Hypertension     There are no active problems to display for this patient.   Past Surgical History:  Procedure Laterality Date  . ABDOMINAL HYSTERECTOMY       OB History   No obstetric history on file.      Home Medications    Prior to Admission medications   Medication Sig Start Date End Date Taking? Authorizing Provider  allopurinol (ZYLOPRIM) 100 MG tablet Take 100 mg by mouth daily.    [provider]  cetirizine (ZYRTEC) 10 MG tablet Take 10 mg by mouth daily.    [provider]  cyclobenzaprine (FLEXERIL) 10 MG tablet Take 10 mg by mouth 3 (three) times daily as needed for muscle spasms.     [provider]  diclofenac (VOLTAREN) 75 MG EC tablet Take 75 mg by mouth 2 (two) times daily.    [provider]  esomeprazole (NEXIUM) 40 MG capsule Take 40 mg by mouth daily at 12 noon.    [provider]  fluticasone (FLONASE) 50 MCG/ACT nasal spray Place 2 sprays into both nostrils daily.    [provider]  hydrochlorothiazide (HYDRODIURIL) 25 MG tablet Take 25 mg by mouth daily.    [provider]  meloxicam (MOBIC) 15 MG tablet Take 15 mg by mouth daily.    [provider]  metroNIDAZOLE (FLAGYL) 500 MG tablet Take 1 tablet (500 mg total) by mouth 2 (two) times daily. 01/26/19   Ward, Layla MawKristen N, DO  ondansetron (ZOFRAN ODT) 4 MG disintegrating tablet Take 1 tablet (4 mg total) by mouth every 6 (six) hours as needed for nausea or vomiting. 01/26/19   Ward, Layla MawKristen N, DO  oxycodone (OXY-IR) 5 MG capsule Take 5 mg by mouth 3 (three) times daily as needed.    [provider]  valsartan (DIOVAN) 80 MG tablet Take 80 mg by mouth daily.    [provider]    Family History No family history on file.  Social History Social History   Tobacco Use  . Smoking status:  Current Every Day Smoker    Packs/day: 1.00    Types: Cigarettes  . Smokeless tobacco: Never Used  Substance Use Topics  . Alcohol use: Not Currently  . Drug use: No     Allergies   Patient has no known allergies.   Review of Systems Review of Systems  Constitutional: Positive for fatigue. Negative for fever.  HENT: Positive for sore throat. Negative for congestion and rhinorrhea.   Eyes: Negative for visual disturbance.  Respiratory: Negative for cough and shortness of breath.   Cardiovascular: Negative for chest pain.  Gastrointestinal: Positive for diarrhea and nausea. Negative for abdominal pain and vomiting.  Genitourinary: Negative for dysuria.  Musculoskeletal: Positive for myalgias.  Skin: Negative for rash.  Neurological: Negative for  loss of consciousness and headaches.     Physical Exam Updated Vital Signs BP (!) 149/110 (BP Location: Right Arm)   Pulse 91   Temp 98.1 F (36.7 C) (Oral)   Resp 18   SpO2 100%   Physical Exam Vitals signs and nursing note reviewed.  Constitutional:      General: She is not in acute distress.    Appearance: She is well-developed.  HENT:     Head: Normocephalic and atraumatic.     Mouth/Throat:     Mouth: Mucous membranes are moist.     Pharynx: Oropharynx is clear.  Eyes:     Conjunctiva/sclera: Conjunctivae normal.  Neck:     Musculoskeletal: Neck supple.  Cardiovascular:     Rate and Rhythm: Normal rate and regular rhythm.     Heart sounds: No murmur.  Pulmonary:     Effort: Pulmonary effort is normal. No respiratory distress.     Breath sounds: Normal breath sounds.  Abdominal:     Palpations: Abdomen is soft.     Tenderness: There is no abdominal tenderness.  Musculoskeletal: Normal range of motion.     Right lower leg: No edema.     Left lower leg: No edema.  Skin:    General: Skin is warm and dry.     Capillary Refill: Capillary refill takes less than 2 seconds.  Neurological:     General: No focal deficit present.     Mental Status: She is alert and oriented to person, place, and time.      ED Treatments / Results  Labs (all labs ordered are listed, but only abnormal results are displayed) Labs Reviewed  NOVEL CORONAVIRUS, NAA (HOSPITAL ORDER, SEND-OUT TO REF LAB)    EKG None  Radiology Dg Chest Port 1 View  Result Date: 06/10/2019 CLINICAL DATA:  Fatigue, body aches EXAM: PORTABLE CHEST 1 VIEW COMPARISON:  None. FINDINGS: Lungs are clear.  No pleural effusion or pneumothorax. The heart is normal in size. IMPRESSION: No evidence of acute cardiopulmonary disease. Electronically Signed   By: Julian Hy M.D.   On: 06/10/2019 09:44    Procedures Procedures (including critical care time)  Medications Ordered in ED Medications - No data  to display   Initial Impression / Assessment and Plan / ED Course  I have reviewed the triage vital signs and the nursing notes.  Pertinent labs & imaging results that were available during my care of the patient were reviewed by me and considered in my medical decision making (see chart for details).  Clinical Course as of Jun 09 1530  Sat Jun 10, 2019  0832 Patient here with fatigue and body aches since yesterday.  History of coronavirus.  She was here yesterday  although was not evaluated but had lab work done in triage which showing a normal CBC normal chemistry normal urine.  Will check send a Covid and get a chest x-ray.   [MB]    Clinical Course User Index [MB] Terrilee FilesButler, Donice Alperin C, MD        Final Clinical Impressions(s) / ED Diagnoses   Final diagnoses:  Fatigue, unspecified type  Myalgia    ED Discharge Orders    None       Terrilee FilesButler, Nakeda Lebron C, MD 06/10/19 1531

## 2019-06-11 LAB — NOVEL CORONAVIRUS, NAA (HOSP ORDER, SEND-OUT TO REF LAB; TAT 18-24 HRS): SARS-CoV-2, NAA: NOT DETECTED

## 2019-06-14 ENCOUNTER — Emergency Department (HOSPITAL_BASED_OUTPATIENT_CLINIC_OR_DEPARTMENT_OTHER): Payer: Medicaid Other

## 2019-06-14 ENCOUNTER — Emergency Department (HOSPITAL_BASED_OUTPATIENT_CLINIC_OR_DEPARTMENT_OTHER)
Admission: EM | Admit: 2019-06-14 | Discharge: 2019-06-15 | Disposition: A | Discharge status: Home / Self Care | Payer: Medicaid Other | Attending: Emergency Medicine | Admitting: Emergency Medicine

## 2019-06-14 ENCOUNTER — Other Ambulatory Visit: Payer: Self-pay

## 2019-06-14 DIAGNOSIS — R002 Palpitations: Secondary | ICD-10-CM

## 2019-06-14 DIAGNOSIS — I10 Essential (primary) hypertension: Secondary | ICD-10-CM | POA: Insufficient documentation

## 2019-06-14 DIAGNOSIS — F17210 Nicotine dependence, cigarettes, uncomplicated: Secondary | ICD-10-CM | POA: Insufficient documentation

## 2019-06-14 DIAGNOSIS — Z79899 Other long term (current) drug therapy: Secondary | ICD-10-CM | POA: Diagnosis not present

## 2019-06-14 LAB — CBC WITH DIFFERENTIAL/PLATELET
Abs Immature Granulocytes: 0.03 10*3/uL (ref 0.00–0.07)
Basophils Absolute: 0 10*3/uL (ref 0.0–0.1)
Basophils Relative: 0 %
Eosinophils Absolute: 0.1 10*3/uL (ref 0.0–0.5)
Eosinophils Relative: 1 %
HCT: 42.4 % (ref 36.0–46.0)
Hemoglobin: 13.6 g/dL (ref 12.0–15.0)
Immature Granulocytes: 0 %
Lymphocytes Relative: 29 %
Lymphs Abs: 2.6 10*3/uL (ref 0.7–4.0)
MCH: 28.3 pg (ref 26.0–34.0)
MCHC: 32.1 g/dL (ref 30.0–36.0)
MCV: 88.3 fL (ref 80.0–100.0)
Monocytes Absolute: 0.5 10*3/uL (ref 0.1–1.0)
Monocytes Relative: 6 %
Neutro Abs: 5.7 10*3/uL (ref 1.7–7.7)
Neutrophils Relative %: 64 %
Platelets: 330 10*3/uL (ref 150–400)
RBC: 4.8 MIL/uL (ref 3.87–5.11)
RDW: 15.1 % (ref 11.5–15.5)
WBC: 9 10*3/uL (ref 4.0–10.5)
nRBC: 0 % (ref 0.0–0.2)

## 2019-06-14 LAB — BASIC METABOLIC PANEL
Anion gap: 11 (ref 5–15)
BUN: 13 mg/dL (ref 6–20)
CO2: 26 mmol/L (ref 22–32)
Calcium: 9 mg/dL (ref 8.9–10.3)
Chloride: 99 mmol/L (ref 98–111)
Creatinine, Ser: 0.86 mg/dL (ref 0.44–1.00)
GFR calc Af Amer: >60 mL/min (ref >60)
GFR calc non Af Amer: >60 mL/min (ref >60)
Glucose, Bld: 130 mg/dL — ABNORMAL HIGH (ref 70–99)
Potassium: 3 mmol/L — ABNORMAL LOW (ref 3.5–5.1)
Sodium: 136 mmol/L (ref 135–145)

## 2019-06-14 MED ORDER — ONDANSETRON HCL 4 MG/2ML IJ SOLN
4.0000 mg | Freq: Once | INTRAMUSCULAR | Status: AC
  Administered 2019-06-14: 4 mg via INTRAVENOUS
  Filled 2019-06-14: qty 2

## 2019-06-14 MED ORDER — IOHEXOL 350 MG/ML SOLN
100.0000 mL | Freq: Once | INTRAVENOUS | Status: AC | PRN
  Administered 2019-06-14: 100 mL via INTRAVENOUS

## 2019-06-14 MED ORDER — ALBUTEROL SULFATE HFA 108 (90 BASE) MCG/ACT IN AERS: 8.0000 | INHALATION_SPRAY | Freq: Once | RESPIRATORY_TRACT | Status: DC

## 2019-06-14 NOTE — ED Triage Notes (Signed)
Presents with one hour of feeling like her heart is racing associated with "feeling very anxious," SOB and dizziness. She is currently wearing a heart monitor due similar episodes in the past.

## 2019-06-15 LAB — TROPONIN I (HIGH SENSITIVITY): Troponin I (High Sensitivity): 6 ng/L (ref <18)

## 2019-06-15 MED ORDER — POTASSIUM CHLORIDE CRYS ER 20 MEQ PO TBCR
60.0000 meq | EXTENDED_RELEASE_TABLET | Freq: Once | ORAL | Status: AC
  Administered 2019-06-15: 60 meq via ORAL
  Filled 2019-06-15: qty 3

## 2019-06-15 MED ORDER — KETOROLAC TROMETHAMINE 30 MG/ML IJ SOLN
15.0000 mg | Freq: Once | INTRAMUSCULAR | Status: AC
  Administered 2019-06-15: 15 mg via INTRAVENOUS
  Filled 2019-06-15: qty 1

## 2019-06-15 NOTE — ED Provider Notes (Addendum)
[facility] EMERGENCY DEPARTMENT Provider Note   CSN: [id number] Arrival date & time: 06/14/19  2313     History   Chief Complaint Chief Complaint  Patient presents with   Palpitations    HPI Theresa Miller is a 48 y.o. female.     The history is provided by the patient.  Palpitations Palpitations quality:  Fast Onset quality:  Sudden Timing:  Constant Progression:  Unchanged Chronicity:  Recurrent Context: not anxiety, not appetite suppressants, not blood loss, not bronchodilators, not caffeine, not dehydration, not exercise, not hyperventilation, not illicit drugs, not nicotine and not stimulant use   Relieved by:  Nothing Worsened by:  Nothing Ineffective treatments:  None tried Associated symptoms: no back pain, no chest pain, no chest pressure, no cough, no diaphoresis, no dizziness, no hemoptysis, no lower extremity edema, no malaise/fatigue, no nausea, no near-syncope, no numbness, no orthopnea, no PND, no shortness of breath, no syncope, no vomiting and no weakness   Associated symptoms comment:  Muscle pain and  Risk factors: no diabetes mellitus   Ongoing muscle pain since having covid and palpitations.  Is wearing a holter monitor for 7 days.  No weakness nor numbness.  No f/c/r.  No swelling.  No rashes.    Past Medical History:  Diagnosis Date   Arthritis    Chronic pain    COVID-19    Hypertension     There are no active problems to display for this patient.   Past Surgical History:  Procedure Laterality Date   ABDOMINAL HYSTERECTOMY       OB History   No obstetric history on file.      Home Medications    Prior to Admission medications   Medication Sig Start Date End Date Taking? Authorizing Provider  allopurinol (ZYLOPRIM) 100 MG tablet Take 100 mg by mouth daily.    [provider]  cetirizine (ZYRTEC) 10 MG tablet Take 10 mg by mouth daily.    [provider]  cyclobenzaprine (FLEXERIL) 10 MG tablet  Take 10 mg by mouth 3 (three) times daily as needed for muscle spasms.    [provider]  diclofenac (VOLTAREN) 75 MG EC tablet Take 75 mg by mouth 2 (two) times daily.    [provider]  esomeprazole (NEXIUM) 40 MG capsule Take 40 mg by mouth daily at 12 noon.    [provider]  fluticasone (FLONASE) 50 MCG/ACT nasal spray Place 2 sprays into both nostrils daily.    [provider]  hydrochlorothiazide (HYDRODIURIL) 25 MG tablet Take 25 mg by mouth daily.    [provider]  meloxicam (MOBIC) 15 MG tablet Take 15 mg by mouth daily.    [provider]  metroNIDAZOLE (FLAGYL) 500 MG tablet Take 1 tablet (500 mg total) by mouth 2 (two) times daily. 01/26/19   Ward, Layla Maw, DO  ondansetron (ZOFRAN ODT) 4 MG disintegrating tablet Take 1 tablet (4 mg total) by mouth every 6 (six) hours as needed for nausea or vomiting. 01/26/19   Ward, Layla Maw, DO  oxycodone (OXY-IR) 5 MG capsule Take 5 mg by mouth 3 (three) times daily as needed.    [provider]  valsartan (DIOVAN) 80 MG tablet Take 80 mg by mouth daily.    [provider]    Family History No family history on file.  Social History Social History   Tobacco Use   Smoking status: Current Every Day Smoker    Packs/day: 1.00  Types: Cigarettes   Smokeless tobacco: Never Used  Substance Use Topics   Alcohol use: Not Currently   Drug use: No     Allergies   Patient has no known allergies.   Review of Systems Review of Systems  Constitutional: Negative for diaphoresis and malaise/fatigue.  HENT: Negative for congestion.   Eyes: Negative for visual disturbance.  Respiratory: Negative for cough, hemoptysis and shortness of breath.   Cardiovascular: Positive for palpitations. Negative for chest pain, orthopnea, syncope, PND and near-syncope.  Gastrointestinal: Negative for abdominal pain, nausea and vomiting.  Genitourinary: Negative for difficulty  urinating.  Musculoskeletal: Positive for myalgias. Negative for back pain.  Neurological: Negative for dizziness, weakness and numbness.  Hematological: Negative for adenopathy.  Psychiatric/Behavioral: Negative for agitation.  All other systems reviewed and are negative.    Physical Exam Updated Vital Signs BP 129/87 (BP Location: Right Arm)    Pulse 97    Temp 97.8 F (36.6 C) (Oral)    Resp 18    Ht 5\' 4"  (1.626 m)    Wt 104 kg    SpO2 97%    BMI 39.36 kg/m   Physical Exam Vitals signs and nursing note reviewed.  Constitutional:      General: She is not in acute distress.    Appearance: She is normal weight.  HENT:     Head: Normocephalic and atraumatic.     Nose: Nose normal.  Eyes:     Conjunctiva/sclera: Conjunctivae normal.     Pupils: Pupils are equal, round, and reactive to light.  Neck:     Musculoskeletal: Normal range of motion and neck supple.  Cardiovascular:     Rate and Rhythm: Normal rate and regular rhythm.     Pulses: Normal pulses.     Heart sounds: Normal heart sounds.  Pulmonary:     Effort: Pulmonary effort is normal.     Breath sounds: Normal breath sounds.  Abdominal:     General: Abdomen is flat. Bowel sounds are normal.     Tenderness: There is no abdominal tenderness. There is no guarding or rebound.  Musculoskeletal: Normal range of motion.     Right lower leg: No edema.     Left lower leg: No edema.  Skin:    General: Skin is warm and dry.     Capillary Refill: Capillary refill takes less than 2 seconds.  Neurological:     General: No focal deficit present.     Mental Status: She is alert and oriented to person, place, and time.  Psychiatric:        Mood and Affect: Mood normal.        Behavior: Behavior normal.      ED Treatments / Results  Labs (all labs ordered are listed, but only abnormal results are displayed) Labs Reviewed  BASIC METABOLIC PANEL - Abnormal; Notable for the following components:      Result Value    Potassium 3.0 (*)    Glucose, Bld 130 (*)    All other components within normal limits  CBC WITH DIFFERENTIAL/PLATELET  URINALYSIS, ROUTINE W REFLEX MICROSCOPIC    EKG  EKG Interpretation  Date/Time:  Wednesday June 14 2019 23:24:18 EDT Ventricular Rate:  92 PR Interval:    QRS Duration: 62 QT Interval:  405 QTC Calculation: 502 R Axis:   39 Text Interpretation:  Sinus rhythm Borderline abnrm T, anterolateral leads Borderline prolonged QT interval Confirmed by Nicanor AlconPalumbo, Fumi Guadron ([id number]) on 06/15/2019 12:53:40 AM  Radiology Ct Angio Chest Pe W And/or Wo Contrast  Result Date: 06/15/2019 CLINICAL DATA:  48 year old female with tachycardia and shortness of breath. EXAM: CT ANGIOGRAPHY CHEST WITH CONTRAST TECHNIQUE: Multidetector CT imaging of the chest was performed using the standard protocol during bolus administration of intravenous contrast. Multiplanar CT image reconstructions and MIPs were obtained to evaluate the vascular anatomy. CONTRAST:  158mL OMNIPAQUE IOHEXOL 350 MG/ML SOLN COMPARISON:  Chest radiograph dated 06/10/2019 FINDINGS: Cardiovascular: There is no cardiomegaly or pericardial effusion. The thoracic aorta is unremarkable. The origins of the great vessels of the aortic arch appear patent as visualized. Evaluation of the pulmonary arteries is somewhat limited due to suboptimal opacification and timing of the contrast. No pulmonary artery embolus identified. Mediastinum/Nodes: There is no hilar or mediastinal adenopathy. The esophagus is grossly unremarkable. No mediastinal fluid collection. Lungs/Pleura: Lungs are clear. No pleural effusion or pneumothorax. Upper Abdomen: Cholecystectomy. The visualized upper abdomen is otherwise unremarkable. Musculoskeletal: No chest wall abnormality. No acute or significant osseous findings. Review of the MIP images confirms the above findings. IMPRESSION: No acute intrathoracic pathology. No CT evidence of pulmonary embolism.  Electronically Signed   By: Anner Crete M.D.   On: 06/15/2019 00:46    Procedures Procedures (including critical care time)  Medications Ordered in ED Medications  ketorolac (TORADOL) 30 MG/ML injection 15 mg (has no administration in time range)  ondansetron (ZOFRAN) injection 4 mg (4 mg Intravenous Given 06/14/19 2343)  iohexol (OMNIPAQUE) 350 MG/ML injection 100 mL (100 mLs Intravenous Contrast Given 06/14/19 2357)  potassium chloride SA (K-DUR) CR tablet 60 mEq (60 mEq Oral Given 06/15/19 0031)    No PE no signs or ACS.  Take tylenol and ibuprofen for pain and follow up with your PMD for ongoing testing.  COVID is currently negative.  I am not sure is this is ongoing symptoms from covid.  As symptoms are ongoing one negative troponin is sufficient to exclude ACS.  Ruled out for PE in the ED.    Theresa Miller was evaluated in Emergency Department on 06/15/2019 for the symptoms described in the history of present illness. She was evaluated in the context of the global COVID-19 pandemic, which necessitated consideration that the patient might be at risk for infection with the SARS-CoV-2 virus that causes COVID-19. Institutional protocols and algorithms that pertain to the evaluation of patients at risk for COVID-19 are in a state of rapid change based on information released by regulatory bodies including the CDC and federal and state organizations. These policies and algorithms were followed during the patient's care in the ED.   Final Clinical Impressions(s) / ED Diagnoses   Return for intractable cough, coughing up blood,fevers >100.4 unrelieved by medication, shortness of breath, intractable vomiting, chest pain, shortness of breath, weakness,numbness, changes in speech, facial asymmetry,abdominal pain, passing out,Inability to tolerate liquids or food, cough, altered mental status or any concerns. No signs of systemic illness or infection. The patient is nontoxic-appearing on  exam and vital signs are within normal limits.   I have reviewed the triage vital signs and the nursing notes. Pertinent labs &imaging results that were available during my care of the patient were reviewed by me and considered in my medical decision making (see chart for details).  After history, exam, and medical workup I feel the patient has been appropriately medically screened and is safe for discharge home. Pertinent diagnoses were discussed with the patient. Patient was given return precautions   Kris No, MD 06/15/19 3710  Graeson Nouri, MD 06/15/19 [phone]

## 2019-07-28 ENCOUNTER — Emergency Department (HOSPITAL_BASED_OUTPATIENT_CLINIC_OR_DEPARTMENT_OTHER)
Admission: EM | Admit: 2019-07-28 | Discharge: 2019-07-28 | Disposition: A | Discharge status: Home / Self Care | Payer: Medicaid Other | Attending: Emergency Medicine | Admitting: Emergency Medicine

## 2019-07-28 ENCOUNTER — Encounter (HOSPITAL_BASED_OUTPATIENT_CLINIC_OR_DEPARTMENT_OTHER): Payer: Self-pay | Admitting: *Deleted

## 2019-07-28 ENCOUNTER — Other Ambulatory Visit: Payer: Self-pay

## 2019-07-28 DIAGNOSIS — F17210 Nicotine dependence, cigarettes, uncomplicated: Secondary | ICD-10-CM | POA: Insufficient documentation

## 2019-07-28 DIAGNOSIS — I10 Essential (primary) hypertension: Secondary | ICD-10-CM | POA: Insufficient documentation

## 2019-07-28 DIAGNOSIS — R5383 Other fatigue: Secondary | ICD-10-CM | POA: Diagnosis not present

## 2019-07-28 DIAGNOSIS — R9431 Abnormal electrocardiogram [ECG] [EKG]: Secondary | ICD-10-CM | POA: Diagnosis not present

## 2019-07-28 LAB — BASIC METABOLIC PANEL
Anion gap: 12 (ref 5–15)
BUN: 17 mg/dL (ref 6–20)
CO2: 27 mmol/L (ref 22–32)
Calcium: 9.3 mg/dL (ref 8.9–10.3)
Chloride: 100 mmol/L (ref 98–111)
Creatinine, Ser: 0.99 mg/dL (ref 0.44–1.00)
GFR calc Af Amer: >60 mL/min (ref >60)
GFR calc non Af Amer: >60 mL/min (ref >60)
Glucose, Bld: 111 mg/dL — ABNORMAL HIGH (ref 70–99)
Potassium: 3.8 mmol/L (ref 3.5–5.1)
Sodium: 139 mmol/L (ref 135–145)

## 2019-07-28 LAB — CBC WITH DIFFERENTIAL/PLATELET
Abs Immature Granulocytes: 0.06 10*3/uL (ref 0.00–0.07)
Basophils Absolute: 0 10*3/uL (ref 0.0–0.1)
Basophils Relative: 0 %
Eosinophils Absolute: 0.1 10*3/uL (ref 0.0–0.5)
Eosinophils Relative: 1 %
HCT: 41 % (ref 36.0–46.0)
Hemoglobin: 13.1 g/dL (ref 12.0–15.0)
Immature Granulocytes: 1 %
Lymphocytes Relative: 24 %
Lymphs Abs: 2.1 10*3/uL (ref 0.7–4.0)
MCH: 28.3 pg (ref 26.0–34.0)
MCHC: 32 g/dL (ref 30.0–36.0)
MCV: 88.6 fL (ref 80.0–100.0)
Monocytes Absolute: 0.5 10*3/uL (ref 0.1–1.0)
Monocytes Relative: 6 %
Neutro Abs: 6 10*3/uL (ref 1.7–7.7)
Neutrophils Relative %: 68 %
Platelets: 331 10*3/uL (ref 150–400)
RBC: 4.63 MIL/uL (ref 3.87–5.11)
RDW: 15.5 % (ref 11.5–15.5)
WBC: 8.9 10*3/uL (ref 4.0–10.5)
nRBC: 0 % (ref 0.0–0.2)

## 2019-07-28 LAB — URINALYSIS, MICROSCOPIC (REFLEX)

## 2019-07-28 LAB — URINALYSIS, ROUTINE W REFLEX MICROSCOPIC
Bilirubin Urine: NEGATIVE
Glucose, UA: NEGATIVE mg/dL
Ketones, ur: NEGATIVE mg/dL
Leukocytes,Ua: NEGATIVE
Nitrite: NEGATIVE
Protein, ur: NEGATIVE mg/dL
Specific Gravity, Urine: 1.01 (ref 1.005–1.030)
pH: 6.5 (ref 5.0–8.0)

## 2019-07-28 MED ORDER — SODIUM CHLORIDE 0.9 % IV BOLUS
500.0000 mL | Freq: Once | INTRAVENOUS | Status: AC
  Administered 2019-07-28: 21:00:00 500 mL via INTRAVENOUS

## 2019-07-28 MED ORDER — MECLIZINE HCL 12.5 MG PO TABS: 12.5000 mg | ORAL_TABLET | Freq: Three times a day (TID) | ORAL | 0 refills | Status: AC | PRN

## 2019-07-28 NOTE — ED Notes (Signed)
ED Provider at bedside. 

## 2019-07-28 NOTE — Discharge Instructions (Signed)
You were seen in the emergency department for generalized weakness.  You appear to have some mild dehydration.  Please drink plenty of fluids and follow-up with your primary care physician moving forward.  Return to the emergency department any new or suddenly worsening symptoms.

## 2019-07-28 NOTE — ED Triage Notes (Signed)
Pt c/o generalized fatigue x 1 day

## 2019-07-28 NOTE — ED Provider Notes (Signed)
Emergency Department Provider Note   I have reviewed the triage vital signs and the nursing notes.   HISTORY  Chief Complaint Fatigue   HPI Theresa Miller is a 48 y.o. female with PMH of arthritis and HTN presents to the emergency department for evaluation of acute onset generalized fatigue which began today.  Patient states she was at her pain management doctors office when she suddenly felt weak all over.  She denies chest pain, heart palpitations, shortness of breath.  No fevers or chills.  She denies any abdominal pain, vomiting, diarrhea.  She continues to feel generalized weakness throughout the day and so presents to the emergency department for evaluation.  No radiation of symptoms or other modifying factors.  Past Medical History:  Diagnosis Date  . Arthritis   . Chronic pain   . COVID-19   . Hypertension     There are no active problems to display for this patient.   Past Surgical History:  Procedure Laterality Date  . ABDOMINAL HYSTERECTOMY      Allergies Patient has no known allergies.  No family history on file.  Social History Social History   Tobacco Use  . Smoking status: Current Every Day Smoker    Packs/day: 1.00    Types: Cigarettes  . Smokeless tobacco: Never Used  Substance Use Topics  . Alcohol use: Not Currently  . Drug use: No    Review of Systems  Constitutional: No fever/chills. Positive generalized weakness and lightheadedness.  Eyes: No visual changes. ENT: No sore throat. Cardiovascular: Denies chest pain. Respiratory: Denies shortness of breath. Gastrointestinal: No abdominal pain.  No nausea, no vomiting.  No diarrhea.  No constipation. Genitourinary: Negative for dysuria. Musculoskeletal: Negative for back pain. Skin: Negative for rash. Neurological: Negative for headaches, focal weakness or numbness.  10-point ROS otherwise negative.  ____________________________________________   PHYSICAL EXAM:  VITAL SIGNS:  ED Triage Vitals  Enc Vitals Group     BP 07/28/19 2017 103/64     Pulse Rate 07/28/19 2017 (!) 114     Resp 07/28/19 2017 20     Temp 07/28/19 2017 98.8 F (37.1 C)     Temp Source 07/28/19 2017 Oral     SpO2 07/28/19 2017 100 %     Weight 07/28/19 2002 232 lb (105.2 kg)     Height 07/28/19 2002 5\' 4"  (1.626 m)   Constitutional: Alert and oriented. Well appearing and in no acute distress. Eyes: Conjunctivae are normal.  Head: Atraumatic. Nose: No congestion/rhinnorhea. Mouth/Throat: Mucous membranes are moist.  Neck: No stridor.  Cardiovascular: Tachycardia. Good peripheral circulation. Grossly normal heart sounds.   Respiratory: Normal respiratory effort.  No retractions. Lungs CTAB. Gastrointestinal: Soft and nontender. No distention.  Musculoskeletal: No lower extremity tenderness nor edema. No gross deformities of extremities. Neurologic:  Normal speech and language. No gross focal neurologic deficits are appreciated.  Skin:  Skin is warm, dry and intact. No rash noted.  ____________________________________________   LABS (all labs ordered are listed, but only abnormal results are displayed)  Labs Reviewed  URINALYSIS, ROUTINE W REFLEX MICROSCOPIC - Abnormal; Notable for the following components:      Result Value   Hgb urine dipstick TRACE (*)    All other components within normal limits  BASIC METABOLIC PANEL - Abnormal; Notable for the following components:   Glucose, Bld 111 (*)    All other components within normal limits  URINALYSIS, MICROSCOPIC (REFLEX) - Abnormal; Notable for the following components:   Bacteria,  UA RARE (*)    All other components within normal limits  CBC WITH DIFFERENTIAL/PLATELET   ____________________________________________  EKG   EKG Interpretation  Date/Time:  Friday July 28 2019 20:58:48 EDT Ventricular Rate:  98 PR Interval:    QRS Duration: 71 QT Interval:  461 QTC Calculation: 589 R Axis:   39 Text Interpretation:   Sinus rhythm Borderline short PR interval Borderline T abnormalities, anterior leads Prolonged QT interval No STEMI  Confirmed by Alona Bene [phone]) on 07/28/2019 9:02:14 PM      ____________________________________________   PROCEDURES  Procedure(s) performed:   Procedures  None  ____________________________________________   INITIAL IMPRESSION / ASSESSMENT AND PLAN / ED COURSE  Pertinent labs & imaging results that were available during my care of the patient were reviewed by me and considered in my medical decision making (see chart for details).   Patient presents to the emergency department for evaluation of generalized fatigue starting today.  No associated symptoms or modifying factors.  Mild tachycardia on arrival.  Given this, plan for gentle IV fluids, screening blood work, UA, reassess.  EKG pending.  KG reassuring.  Labs and UA reviewed with no acute findings.  Patient feeling improved after IV fluids.  Meclizine as needed for symptom management at home but encouraged primarily rest and p.o. fluids as opposed to adding additional medication.  Discussed need for PCP follow-up. ____________________________________________  FINAL CLINICAL IMPRESSION(S) / ED DIAGNOSES  Final diagnoses:  Fatigue, unspecified type  Prolonged Q-T interval on ECG     MEDICATIONS GIVEN DURING THIS VISIT:  Medications  sodium chloride 0.9 % bolus 500 mL (500 mLs Intravenous New Bag/Given 07/28/19 2113)     NEW OUTPATIENT MEDICATIONS STARTED DURING THIS VISIT:  New Prescriptions   MECLIZINE (ANTIVERT) 12.5 MG TABLET    Take 1 tablet (12.5 mg total) by mouth 3 (three) times daily as needed for dizziness.    Note:  This document was prepared using Dragon voice recognition software and may include unintentional dictation errors.  Alona Bene, MD, [facility]    Long, Arlyss Repress, MD 07/28/19 2137

## 2019-11-17 ENCOUNTER — Emergency Department (HOSPITAL_BASED_OUTPATIENT_CLINIC_OR_DEPARTMENT_OTHER)
Admission: EM | Admit: 2019-11-17 | Discharge: 2019-11-17 | Disposition: A | Discharge status: Home / Self Care | Payer: Medicaid Other | Attending: Emergency Medicine | Admitting: Emergency Medicine

## 2019-11-17 ENCOUNTER — Encounter (HOSPITAL_BASED_OUTPATIENT_CLINIC_OR_DEPARTMENT_OTHER): Payer: Self-pay

## 2019-11-17 ENCOUNTER — Other Ambulatory Visit: Payer: Self-pay

## 2019-11-17 ENCOUNTER — Emergency Department (HOSPITAL_BASED_OUTPATIENT_CLINIC_OR_DEPARTMENT_OTHER): Payer: Medicaid Other

## 2019-11-17 DIAGNOSIS — R05 Cough: Secondary | ICD-10-CM

## 2019-11-17 DIAGNOSIS — R059 Cough, unspecified: Secondary | ICD-10-CM

## 2019-11-17 DIAGNOSIS — Z79899 Other long term (current) drug therapy: Secondary | ICD-10-CM | POA: Insufficient documentation

## 2019-11-17 DIAGNOSIS — I10 Essential (primary) hypertension: Secondary | ICD-10-CM | POA: Diagnosis not present

## 2019-11-17 DIAGNOSIS — B349 Viral infection, unspecified: Secondary | ICD-10-CM | POA: Diagnosis not present

## 2019-11-17 DIAGNOSIS — Z20822 Contact with and (suspected) exposure to covid-19: Secondary | ICD-10-CM | POA: Insufficient documentation

## 2019-11-17 DIAGNOSIS — E876 Hypokalemia: Secondary | ICD-10-CM | POA: Insufficient documentation

## 2019-11-17 LAB — COMPREHENSIVE METABOLIC PANEL
ALT: 30 U/L (ref 0–44)
AST: 29 U/L (ref 15–41)
Albumin: 4.2 g/dL (ref 3.5–5.0)
Alkaline Phosphatase: 69 U/L (ref 38–126)
Anion gap: 9 (ref 5–15)
BUN: 18 mg/dL (ref 6–20)
CO2: 28 mmol/L (ref 22–32)
Calcium: 9.3 mg/dL (ref 8.9–10.3)
Chloride: 100 mmol/L (ref 98–111)
Creatinine, Ser: 0.78 mg/dL (ref 0.44–1.00)
GFR calc Af Amer: >60 mL/min (ref >60)
GFR calc non Af Amer: >60 mL/min (ref >60)
Glucose, Bld: 104 mg/dL — ABNORMAL HIGH (ref 70–99)
Potassium: 3.3 mmol/L — ABNORMAL LOW (ref 3.5–5.1)
Sodium: 137 mmol/L (ref 135–145)
Total Bilirubin: 0.5 mg/dL (ref 0.3–1.2)
Total Protein: 7.8 g/dL (ref 6.5–8.1)

## 2019-11-17 LAB — CBC WITH DIFFERENTIAL/PLATELET
Abs Immature Granulocytes: 0.04 10*3/uL (ref 0.00–0.07)
Basophils Absolute: 0 10*3/uL (ref 0.0–0.1)
Basophils Relative: 0 %
Eosinophils Absolute: 0.1 10*3/uL (ref 0.0–0.5)
Eosinophils Relative: 1 %
HCT: 43.6 % (ref 36.0–46.0)
Hemoglobin: 14 g/dL (ref 12.0–15.0)
Immature Granulocytes: 1 %
Lymphocytes Relative: 28 %
Lymphs Abs: 2.1 10*3/uL (ref 0.7–4.0)
MCH: 29.4 pg (ref 26.0–34.0)
MCHC: 32.1 g/dL (ref 30.0–36.0)
MCV: 91.4 fL (ref 80.0–100.0)
Monocytes Absolute: 0.4 10*3/uL (ref 0.1–1.0)
Monocytes Relative: 6 %
Neutro Abs: 4.8 10*3/uL (ref 1.7–7.7)
Neutrophils Relative %: 64 %
Platelets: 353 10*3/uL (ref 150–400)
RBC: 4.77 MIL/uL (ref 3.87–5.11)
RDW: 14 % (ref 11.5–15.5)
WBC: 7.5 10*3/uL (ref 4.0–10.5)
nRBC: 0 % (ref 0.0–0.2)

## 2019-11-17 LAB — LIPASE, BLOOD: Lipase: 19 U/L (ref 11–51)

## 2019-11-17 LAB — SARS CORONAVIRUS 2 AG (30 MIN TAT): SARS Coronavirus 2 Ag: NEGATIVE

## 2019-11-17 MED ORDER — POTASSIUM CHLORIDE ER 10 MEQ PO TBCR: 10.0000 meq | EXTENDED_RELEASE_TABLET | Freq: Every day | ORAL | 0 refills | Status: AC

## 2019-11-17 MED ORDER — ONDANSETRON HCL 4 MG PO TABS: 4.0000 mg | ORAL_TABLET | Freq: Four times a day (QID) | ORAL | 0 refills | Status: AC

## 2019-11-17 MED ORDER — POTASSIUM CHLORIDE CRYS ER 20 MEQ PO TBCR
40.0000 meq | EXTENDED_RELEASE_TABLET | Freq: Once | ORAL | Status: AC
  Administered 2019-11-17: 40 meq via ORAL
  Filled 2019-11-17: qty 2

## 2019-11-17 MED ORDER — ONDANSETRON HCL 4 MG/2ML IJ SOLN
4.0000 mg | Freq: Once | INTRAMUSCULAR | Status: AC
  Administered 2019-11-17: 4 mg via INTRAVENOUS
  Filled 2019-11-17: qty 2

## 2019-11-17 MED ORDER — BENZONATATE 100 MG PO CAPS: 100.0000 mg | ORAL_CAPSULE | Freq: Three times a day (TID) | ORAL | 0 refills | Status: AC

## 2019-11-17 NOTE — ED Provider Notes (Signed)
[facility] EMERGENCY DEPARTMENT Provider Note   CSN: [id number] Arrival date & time: 11/17/19  1608     History Chief Complaint  Patient presents with  . Cough    Theresa Miller is a 49 y.o. female with PMHx HTN and chronic pain syndrome who presents to the ED today complaining of gradual onset, constant, dry cough x 2 weeks. Pt also complains of a mild headache, since resolved for the past 2 weeks.  Reports she woke up this morning and felt worse than ever.  She states she is currently complaining of chills, fatigue, and generalized weakness.  She states that she is also had nausea and diarrhea today but no vomiting.  Patient reports that she tested positive for COVID-19 back in March however per chart review patient never had a positive Covid test, it was suspected that she had Covid however with the pain beginning of the global pandemic last year tests were very limited.  Any recent COVID-19 positive exposure however she does report she had an individual over is not typically in her household.  She is a current every day smoker and states she is still smoking about 1 pack/day.  She is denying any worsening shortness of breath than she normally has with her smoking.  Denies fevers, shortness of breath, chest pain, vomiting, blood in stool, urinary sx, or any other associated symptoms.   The history is provided by the patient and medical records.       Past Medical History:  Diagnosis Date  . Arthritis   . Chronic pain   . COVID-19   . Hypertension     There are no problems to display for this patient.   Past Surgical History:  Procedure Laterality Date  . ABDOMINAL HYSTERECTOMY       OB History   No obstetric history on file.     No family history on file.  Social History   Tobacco Use  . Smoking status: Current Every Day Smoker    Packs/day: 1.00    Types: Cigarettes  . Smokeless tobacco: Never Used  Substance Use Topics  . Alcohol use: Yes   Comment: occ  . Drug use: No    Home Medications Prior to Admission medications   Medication Sig Start Date End Date Taking? Authorizing Provider  allopurinol (ZYLOPRIM) 100 MG tablet Take 100 mg by mouth daily.    [provider]  benzonatate (TESSALON) 100 MG capsule Take 1 capsule (100 mg total) by mouth every 8 (eight) hours. 11/17/19   Hyman Hopes, Jayshon Dommer, PA-C  cetirizine (ZYRTEC) 10 MG tablet Take 10 mg by mouth daily.    [provider]  cyclobenzaprine (FLEXERIL) 10 MG tablet Take 10 mg by mouth 3 (three) times daily as needed for muscle spasms.    [provider]  diclofenac (VOLTAREN) 75 MG EC tablet Take 75 mg by mouth 2 (two) times daily.    [provider]  esomeprazole (NEXIUM) 40 MG capsule Take 40 mg by mouth daily at 12 noon.    [provider]  fluticasone (FLONASE) 50 MCG/ACT nasal spray Place 2 sprays into both nostrils daily.    [provider]  hydrochlorothiazide (HYDRODIURIL) 25 MG tablet Take 25 mg by mouth daily.    [provider]  meclizine (ANTIVERT) 12.5 MG tablet Take 1 tablet (12.5 mg total) by mouth 3 (three) times daily as needed for dizziness. 07/28/19   Long, Arlyss Repress, MD  meloxicam (MOBIC) 15 MG tablet Take 15  mg by mouth daily.    [provider]  ondansetron (ZOFRAN ODT) 4 MG disintegrating tablet Take 1 tablet (4 mg total) by mouth every 6 (six) hours as needed for nausea or vomiting. 01/26/19   Ward, Layla Maw, DO  ondansetron (ZOFRAN) 4 MG tablet Take 1 tablet (4 mg total) by mouth every 6 (six) hours. 11/17/19   Hyman Hopes, Colleen Donahoe, PA-C  oxycodone (OXY-IR) 5 MG capsule Take 5 mg by mouth 3 (three) times daily as needed.    [provider]  potassium chloride (KLOR-CON) 10 MEQ tablet Take 1 tablet (10 mEq total) by mouth daily for 5 days. 11/17/19 11/22/19  Hyman Hopes, Kei Langhorst, PA-C  valsartan (DIOVAN) 80 MG tablet Take 80 mg by mouth daily.    [provider]    Allergies      Patient has no known allergies.  Review of Systems   Review of Systems  Constitutional: Positive for chills and fatigue. Negative for fever.  HENT: Negative for congestion.   Eyes: Negative for visual disturbance.  Respiratory: Positive for cough. Negative for shortness of breath.   Cardiovascular: Negative for chest pain.  Gastrointestinal: Positive for abdominal pain, diarrhea and nausea. Negative for constipation and vomiting.  Genitourinary: Negative for difficulty urinating, dysuria, flank pain and frequency.  Musculoskeletal: Negative for myalgias.  Skin: Negative for rash.  Neurological: Positive for headaches (resolved).    Physical Exam Updated Vital Signs BP (!) 157/101 (BP Location: Left Arm)   Pulse (!) 103   Temp 97.7 F (36.5 C) (Oral)   Resp 16   Ht 5\' 4"  (1.626 m)   Wt 103.8 kg   SpO2 100%   BMI 39.29 kg/m   Physical Exam Vitals and nursing note reviewed.  Constitutional:      Appearance: She is not ill-appearing or diaphoretic.  HENT:     Head: Normocephalic and atraumatic.  Eyes:     Conjunctiva/sclera: Conjunctivae normal.  Cardiovascular:     Rate and Rhythm: Normal rate and regular rhythm.     Pulses: Normal pulses.  Pulmonary:     Effort: Pulmonary effort is normal.     Breath sounds: Normal breath sounds. No wheezing, rhonchi or rales.  Abdominal:     Palpations: Abdomen is soft.     Tenderness: There is abdominal tenderness. There is no right CVA tenderness, left CVA tenderness, guarding or rebound.     Comments: Soft, + epigastric abd TTP, +BS throughout, no r/g/r, neg murphy's, neg mcburney's, no CVA TTP  Musculoskeletal:     Cervical back: Neck supple.  Skin:    General: Skin is warm and dry.  Neurological:     Mental Status: She is alert.     ED Results / Procedures / Treatments   Labs (all labs ordered are listed, but only abnormal results are displayed) Labs Reviewed  COMPREHENSIVE METABOLIC PANEL - Abnormal; Notable for the  following components:      Result Value   Potassium 3.3 (*)    Glucose, Bld 104 (*)    All other components within normal limits  SARS CORONAVIRUS 2 AG (30 MIN TAT)  NOVEL CORONAVIRUS, NAA (HOSP ORDER, SEND-OUT TO REF LAB; TAT 18-24 HRS)  CBC WITH DIFFERENTIAL/PLATELET  LIPASE, BLOOD    EKG None  Radiology DG Chest Port 1 View  Result Date: 11/17/2019 CLINICAL DATA:  Body aches, weakness and cough. EXAM: PORTABLE CHEST 1 VIEW COMPARISON:  July 29, 2019 FINDINGS: The heart size and mediastinal contours are within normal limits.  Both lungs are clear. The visualized skeletal structures are unremarkable. IMPRESSION: No active disease. Electronically Signed   By: Virgina Norfolk M.D.   On: 11/17/2019 17:19    Procedures Procedures (including critical care time)  Medications Ordered in ED Medications  potassium chloride SA (KLOR-CON) CR tablet 40 mEq (has no administration in time range)  ondansetron (ZOFRAN) injection 4 mg (4 mg Intravenous Given 11/17/19 1746)    ED Course  I have reviewed the triage vital signs and the nursing notes.  Pertinent labs & imaging results that were available during my care of the patient were reviewed by me and considered in my medical decision making (see chart for details).  49 year old female who presents to the ED today with multiple complaints including chills, fatigue, cough, headache.  She does report she had Covid in March of last year however given it was early in the pandemic we were not testing at that time, suspected COVID-19 diagnosis.  Patient did not have antibody testing to confirm.  Denies any recent COVID-19 positive exposure.  She does report she has been nauseated and had diarrhea today however no vomiting.  On exam patient is afebrile, nontachycardic and nontachypneic.  She does not appear to be in any acute distress.  She has some mild tenderness to the epigastrium.  Past surgical history includes cholecystectomy.  No peritoneal  signs to warrant imaging. Will obtain screening labs at this time, rapid Covid test, chest x-ray due to cough.   Rapid Covid negative. CXR clear.  CBC without leukocytosis. Hgb stable.  CMP with potassium 3.3; will replete in the ED and discharge home with same. No other electrolyte abnormalities. No LFT elevation.  Lipase negative.   Upon reevaluation patient states she feels improved after zofran; will send home with same. Discharged with [facility] as well as tessalon perles. Pt advised to self isolate until she receives her results. She is in agreement with plan and stable for discharge home.   This note was prepared using Dragon voice recognition software and may include unintentional dictation errors due to the inherent limitations of voice recognition software.  Theresa Miller was evaluated in Emergency Department on 11/17/2019 for the symptoms described in the history of present illness. She was evaluated in the context of the global COVID-19 pandemic, which necessitated consideration that the patient might be at risk for infection with the SARS-CoV-2 virus that causes COVID-19. Institutional protocols and algorithms that pertain to the evaluation of patients at risk for COVID-19 are in a state of rapid change based on information released by regulatory bodies including the CDC and federal and state organizations. These policies and algorithms were followed during the patient's care in the ED.     MDM Rules/Calculators/A&P                      Final Clinical Impression(s) / ED Diagnoses Final diagnoses:  Cough  Viral illness  Person under investigation for COVID-19  Hypokalemia    Rx / DC Orders ED Discharge Orders         Ordered    ondansetron (ZOFRAN) 4 MG tablet  Every 6 hours     11/17/19 1819    benzonatate (TESSALON) 100 MG capsule  Every 8 hours     11/17/19 1819    potassium chloride (KLOR-CON) 10 MEQ tablet  Daily     11/17/19 1819           Discharge Instructions      Please  pick up medications and take as prescribed. Your potassium was mildly decreased in the ED today - you will need to have your potassium level rechecked by your PCP in 1-2 weeks.  We have swabbed you for COVID 19 today - please stay home and self isolate until you receive your results. If positive you will need to self isolate for 14 days starting today (cleared: 12/02/2019). If negative you can resume normal daily activity.  You will receive a call if positive. You will not hear from [place] if negative but can log into your mychart results to check on the status of your test.        Tanda Rockers, PA-C 11/17/19 1824    Melene Plan, DO 11/17/19 2036

## 2019-11-17 NOTE — ED Triage Notes (Signed)
Pt c/o cough, fatigue, n/v/d x today-NAD-steady gait

## 2019-11-17 NOTE — Discharge Instructions (Signed)
Please pick up medications and take as prescribed. Your potassium was mildly decreased in the ED today - you will need to have your potassium level rechecked by your PCP in 1-2 weeks.  We have swabbed you for COVID 19 today - please stay home and self isolate until you receive your results. If positive you will need to self isolate for 14 days starting today (cleared: 12/02/2019). If negative you can resume normal daily activity.  You will receive a call if positive. You will not hear from [place] if negative but can log into your mychart results to check on the status of your test.

## 2019-11-19 LAB — NOVEL CORONAVIRUS, NAA (HOSP ORDER, SEND-OUT TO REF LAB; TAT 18-24 HRS): SARS-CoV-2, NAA: NOT DETECTED

## 2019-12-09 ENCOUNTER — Emergency Department (HOSPITAL_BASED_OUTPATIENT_CLINIC_OR_DEPARTMENT_OTHER)
Admission: EM | Admit: 2019-12-09 | Discharge: 2019-12-09 | Disposition: A | Discharge status: Home / Self Care | Payer: Medicaid Other | Attending: Emergency Medicine | Admitting: Emergency Medicine

## 2019-12-09 ENCOUNTER — Other Ambulatory Visit: Payer: Self-pay

## 2019-12-09 ENCOUNTER — Emergency Department (HOSPITAL_BASED_OUTPATIENT_CLINIC_OR_DEPARTMENT_OTHER): Payer: Medicaid Other

## 2019-12-09 ENCOUNTER — Encounter (HOSPITAL_BASED_OUTPATIENT_CLINIC_OR_DEPARTMENT_OTHER): Payer: Self-pay | Admitting: *Deleted

## 2019-12-09 DIAGNOSIS — R1013 Epigastric pain: Secondary | ICD-10-CM | POA: Diagnosis present

## 2019-12-09 DIAGNOSIS — I10 Essential (primary) hypertension: Secondary | ICD-10-CM | POA: Diagnosis not present

## 2019-12-09 DIAGNOSIS — Z79899 Other long term (current) drug therapy: Secondary | ICD-10-CM | POA: Diagnosis not present

## 2019-12-09 DIAGNOSIS — K2950 Unspecified chronic gastritis without bleeding: Secondary | ICD-10-CM | POA: Insufficient documentation

## 2019-12-09 DIAGNOSIS — F17210 Nicotine dependence, cigarettes, uncomplicated: Secondary | ICD-10-CM | POA: Diagnosis not present

## 2019-12-09 LAB — CBC WITH DIFFERENTIAL/PLATELET
Abs Immature Granulocytes: 0.04 10*3/uL (ref 0.00–0.07)
Basophils Absolute: 0 10*3/uL (ref 0.0–0.1)
Basophils Relative: 1 %
Eosinophils Absolute: 0.1 10*3/uL (ref 0.0–0.5)
Eosinophils Relative: 1 %
HCT: 43.9 % (ref 36.0–46.0)
Hemoglobin: 14.3 g/dL (ref 12.0–15.0)
Immature Granulocytes: 1 %
Lymphocytes Relative: 32 %
Lymphs Abs: 2.3 10*3/uL (ref 0.7–4.0)
MCH: 29.9 pg (ref 26.0–34.0)
MCHC: 32.6 g/dL (ref 30.0–36.0)
MCV: 91.6 fL (ref 80.0–100.0)
Monocytes Absolute: 0.4 10*3/uL (ref 0.1–1.0)
Monocytes Relative: 6 %
Neutro Abs: 4.4 10*3/uL (ref 1.7–7.7)
Neutrophils Relative %: 59 %
Platelets: 321 10*3/uL (ref 150–400)
RBC: 4.79 MIL/uL (ref 3.87–5.11)
RDW: 14 % (ref 11.5–15.5)
WBC: 7.2 10*3/uL (ref 4.0–10.5)
nRBC: 0 % (ref 0.0–0.2)

## 2019-12-09 LAB — URINALYSIS, ROUTINE W REFLEX MICROSCOPIC
Bilirubin Urine: NEGATIVE
Glucose, UA: NEGATIVE mg/dL
Ketones, ur: NEGATIVE mg/dL
Leukocytes,Ua: NEGATIVE
Nitrite: NEGATIVE
Protein, ur: NEGATIVE mg/dL
Specific Gravity, Urine: 1.015 (ref 1.005–1.030)
pH: 6.5 (ref 5.0–8.0)

## 2019-12-09 LAB — COMPREHENSIVE METABOLIC PANEL
ALT: 45 U/L — ABNORMAL HIGH (ref 0–44)
AST: 37 U/L (ref 15–41)
Albumin: 4.3 g/dL (ref 3.5–5.0)
Alkaline Phosphatase: 83 U/L (ref 38–126)
Anion gap: 11 (ref 5–15)
BUN: 13 mg/dL (ref 6–20)
CO2: 29 mmol/L (ref 22–32)
Calcium: 9.2 mg/dL (ref 8.9–10.3)
Chloride: 97 mmol/L — ABNORMAL LOW (ref 98–111)
Creatinine, Ser: 0.88 mg/dL (ref 0.44–1.00)
GFR calc Af Amer: >60 mL/min (ref >60)
GFR calc non Af Amer: >60 mL/min (ref >60)
Glucose, Bld: 119 mg/dL — ABNORMAL HIGH (ref 70–99)
Potassium: 2.9 mmol/L — ABNORMAL LOW (ref 3.5–5.1)
Sodium: 137 mmol/L (ref 135–145)
Total Bilirubin: 0.1 mg/dL — ABNORMAL LOW (ref 0.3–1.2)
Total Protein: 7.9 g/dL (ref 6.5–8.1)

## 2019-12-09 LAB — URINALYSIS, MICROSCOPIC (REFLEX)

## 2019-12-09 LAB — LIPASE, BLOOD: Lipase: 21 U/L (ref 11–51)

## 2019-12-09 MED ORDER — POTASSIUM CHLORIDE CRYS ER 20 MEQ PO TBCR
40.0000 meq | EXTENDED_RELEASE_TABLET | Freq: Once | ORAL | Status: AC
  Administered 2019-12-09: 20:00:00 40 meq via ORAL
  Filled 2019-12-09: qty 2

## 2019-12-09 MED ORDER — ALUM & MAG HYDROXIDE-SIMETH 200-200-20 MG/5ML PO SUSP
30.0000 mL | Freq: Once | ORAL | Status: AC
  Administered 2019-12-09: 30 mL via ORAL
  Filled 2019-12-09: qty 30

## 2019-12-09 MED ORDER — SODIUM CHLORIDE 0.9 % IV BOLUS
1000.0000 mL | Freq: Once | INTRAVENOUS | Status: AC
  Administered 2019-12-09: 1000 mL via INTRAVENOUS

## 2019-12-09 MED ORDER — SUCRALFATE 1 GM/10ML PO SUSP
ORAL | Status: AC
  Administered 2019-12-09: 21:00:00 1 g
  Filled 2019-12-09: qty 10

## 2019-12-09 MED ORDER — SUCRALFATE 1 G PO TABS
1.0000 g | ORAL_TABLET | Freq: Once | ORAL | Status: DC
  Filled 2019-12-09: qty 1

## 2019-12-09 MED ORDER — IOHEXOL 300 MG/ML  SOLN
100.0000 mL | Freq: Once | INTRAMUSCULAR | Status: AC | PRN
  Administered 2019-12-09: 20:00:00 100 mL via INTRAVENOUS

## 2019-12-09 MED ORDER — SUCRALFATE 1 GM/10ML PO SUSP: 1.0000 g | Freq: Three times a day (TID) | ORAL | 0 refills | Status: DC

## 2019-12-09 MED ORDER — LIDOCAINE VISCOUS HCL 2 % MT SOLN
15.0000 mL | Freq: Once | OROMUCOSAL | Status: AC
  Administered 2019-12-09: 15 mL via ORAL
  Filled 2019-12-09: qty 15

## 2019-12-09 MED ORDER — ONDANSETRON HCL 4 MG/2ML IJ SOLN
4.0000 mg | Freq: Once | INTRAMUSCULAR | Status: AC
  Administered 2019-12-09: 4 mg via INTRAVENOUS
  Filled 2019-12-09: qty 2

## 2019-12-09 NOTE — ED Provider Notes (Signed)
[facility] EMERGENCY DEPARTMENT Provider Note   CSN: [id number] Arrival date & time: 12/09/19  1651     History Chief Complaint  Patient presents with  . Abdominal Pain    Theresa Miller is a 49 y.o. female.  HPI Patient presents to the emergency department with abdominal discomfort that is mainly mid to upper abdominal pain.  The patient states is been ongoing for about 2 months.  She states she had an endoscopy and colonoscopy done last week.  Patient states that her GI doctor placed her on Protonix.  They did do some sampling of her abdomen the patient states that they are concerned she may have H. pylori.  The patient states that nothing seems make the condition better or worse.  Patient states that she did not take any other medications other than with been prescribed her.  The patient denies chest pain, shortness of breath, headache,blurred vision, neck pain, fever, cough, weakness, numbness, dizziness, anorexia, edema,vomiting, diarrhea, rash, back pain, dysuria, hematemesis, bloody stool, near syncope, or syncope.    Past Medical History:  Diagnosis Date  . Arthritis   . Chronic pain   . COVID-19   . Hypertension     There are no problems to display for this patient.   Past Surgical History:  Procedure Laterality Date  . ABDOMINAL HYSTERECTOMY       OB History   No obstetric history on file.     History reviewed. No pertinent family history.  Social History   Tobacco Use  . Smoking status: Current Every Day Smoker    Packs/day: 1.00    Types: Cigarettes  . Smokeless tobacco: Never Used  Substance Use Topics  . Alcohol use: Yes    Comment: occ  . Drug use: No    Home Medications Prior to Admission medications   Medication Sig Start Date End Date Taking? Authorizing Provider  pantoprazole (PROTONIX) 40 MG tablet Take by mouth. 11/30/19 12/30/19 Yes [provider]  allopurinol (ZYLOPRIM) 100 MG tablet Take 100 mg by mouth daily.     [provider]  benzonatate (TESSALON) 100 MG capsule Take 1 capsule (100 mg total) by mouth every 8 (eight) hours. 11/17/19   Hyman Hopes, Margaux, PA-C  cetirizine (ZYRTEC) 10 MG tablet Take 10 mg by mouth daily.    [provider]  cyclobenzaprine (FLEXERIL) 10 MG tablet Take 10 mg by mouth 3 (three) times daily as needed for muscle spasms.    [provider]  diclofenac (VOLTAREN) 75 MG EC tablet Take 75 mg by mouth 2 (two) times daily.    [provider]  esomeprazole (NEXIUM) 40 MG capsule Take 40 mg by mouth daily at 12 noon.    [provider]  fluticasone (FLONASE) 50 MCG/ACT nasal spray Place 2 sprays into both nostrils daily.    [provider]  hydrochlorothiazide (HYDRODIURIL) 25 MG tablet Take 25 mg by mouth daily.    [provider]  meclizine (ANTIVERT) 12.5 MG tablet Take 1 tablet (12.5 mg total) by mouth 3 (three) times daily as needed for dizziness. 07/28/19   Long, Arlyss Repress, MD  meloxicam (MOBIC) 15 MG tablet Take 15 mg by mouth daily.    [provider]  ondansetron (ZOFRAN ODT) 4 MG disintegrating tablet Take 1 tablet (4 mg total) by mouth every 6 (six) hours as needed for nausea or vomiting. 01/26/19   Ward, Layla Maw, DO  ondansetron (ZOFRAN) 4 MG tablet Take 1 tablet (4 mg total)  by mouth every 6 (six) hours. 11/17/19   Alroy Bailiff, Margaux, PA-C  oxycodone (OXY-IR) 5 MG capsule Take 5 mg by mouth 3 (three) times daily as needed.    [provider]  potassium chloride (KLOR-CON) 10 MEQ tablet Take 1 tablet (10 mEq total) by mouth daily for 5 days. 11/17/19 11/22/19  Alroy Bailiff, Margaux, PA-C  valsartan (DIOVAN) 80 MG tablet Take 80 mg by mouth daily.    [provider]    Allergies    Patient has no known allergies.  Review of Systems   Review of Systems All other systems negative except as documented in the HPI. All pertinent positives and negatives as reviewed in the HPI. Physical Exam Updated  Vital Signs BP 126/90 (BP Location: Left Arm)   Pulse 85   Temp 98.1 F (36.7 C) (Oral)   Resp 18   Ht 5\' 4"  (1.626 m)   Wt 100.7 kg   SpO2 100%   BMI 38.11 kg/m   Physical Exam Vitals and nursing note reviewed.  Constitutional:      General: She is not in acute distress.    Appearance: She is well-developed.  HENT:     Head: Normocephalic and atraumatic.  Eyes:     Pupils: Pupils are equal, round, and reactive to light.  Cardiovascular:     Rate and Rhythm: Normal rate and regular rhythm.     Heart sounds: Normal heart sounds. No murmur. No friction rub. No gallop.   Pulmonary:     Effort: Pulmonary effort is normal. No respiratory distress.     Breath sounds: Normal breath sounds. No wheezing.  Abdominal:     General: Bowel sounds are normal. There is no distension.     Palpations: Abdomen is soft.     Tenderness: There is abdominal tenderness in the epigastric area and periumbilical area.  Musculoskeletal:     Cervical back: Normal range of motion and neck supple.  Skin:    General: Skin is warm and dry.     Capillary Refill: Capillary refill takes less than 2 seconds.     Findings: No erythema or rash.  Neurological:     Mental Status: She is alert and oriented to person, place, and time.     Motor: No abnormal muscle tone.     Coordination: Coordination normal.  Psychiatric:        Behavior: Behavior normal.     ED Results / Procedures / Treatments   Labs (all labs ordered are listed, but only abnormal results are displayed) Labs Reviewed  COMPREHENSIVE METABOLIC PANEL - Abnormal; Notable for the following components:      Result Value   Potassium 2.9 (*)    Chloride 97 (*)    Glucose, Bld 119 (*)    ALT 45 (*)    Total Bilirubin 0.1 (*)    All other components within normal limits  URINALYSIS, ROUTINE W REFLEX MICROSCOPIC - Abnormal; Notable for the following components:   APPearance HAZY (*)    Hgb urine dipstick SMALL (*)    All other components  within normal limits  URINALYSIS, MICROSCOPIC (REFLEX) - Abnormal; Notable for the following components:   Bacteria, UA MANY (*)    All other components within normal limits  CBC WITH DIFFERENTIAL/PLATELET  LIPASE, BLOOD    EKG None  Radiology DG Chest 2 View  Result Date: 12/09/2019 CLINICAL DATA:  Chest pain EXAM: CHEST - 2 VIEW COMPARISON:  11/17/2019 FINDINGS: Lungs are clear.  No pleural  effusion or pneumothorax. The heart is normal in size. Visualized osseous structures are within normal limits. IMPRESSION: Normal chest radiographs. Electronically Signed   By: Charline Bills M.D.   On: 12/09/2019 18:00   CT Abdomen Pelvis W Contrast  Result Date: 12/09/2019 CLINICAL DATA:  49 year old female with abdominal pain and diarrhea. EXAM: CT ABDOMEN AND PELVIS WITH CONTRAST TECHNIQUE: Multidetector CT imaging of the abdomen and pelvis was performed using the standard protocol following bolus administration of intravenous contrast. CONTRAST:  OMNIPAQUE IOHEXOL 300 MG/ML  SOLN COMPARISON:  CT abdomen pelvis dated 04/25/2017. FINDINGS: Lower chest: The visualized lung bases are clear. No intra-abdominal free air or free fluid. Hepatobiliary: Apparent mild fatty infiltration of the liver. No intrahepatic biliary ductal dilatation. Cholecystectomy. Pancreas: Unremarkable. No pancreatic ductal dilatation or surrounding inflammatory changes. Spleen: Normal in size without focal abnormality. Adrenals/Urinary Tract: The adrenal glands are unremarkable. There is no hydronephrosis on either side. There is symmetric enhancement and excretion of contrast by both kidneys. The visualized ureters and urinary bladder appear unremarkable. Stomach/Bowel: There is no bowel obstruction or active inflammation. The appendix is normal. Vascular/Lymphatic: Mild aortoiliac atherosclerotic disease. The IVC is unremarkable. No portal venous gas. There is no adenopathy. Reproductive: Hysterectomy.  No adnexal masses.  Other: None Musculoskeletal: No acute or significant osseous findings. IMPRESSION: No acute intra-abdominal or pelvic pathology. Electronically Signed   By: Elgie Collard M.D.   On: 12/09/2019 19:50    Procedures Procedures (including critical care time)  Medications Ordered in ED Medications  sodium chloride 0.9 % bolus 1,000 mL (0 mLs Intravenous Stopped 12/09/19 1958)  ondansetron (ZOFRAN) injection 4 mg (4 mg Intravenous Given 12/09/19 1725)  iohexol (OMNIPAQUE) 300 MG/ML solution 100 mL (100 mLs Intravenous Contrast Given 12/09/19 1933)  potassium chloride SA (KLOR-CON) CR tablet 40 mEq (40 mEq Oral Given 12/09/19 1953)    ED Course  I have reviewed the triage vital signs and the nursing notes.  Pertinent labs & imaging results that were available during my care of the patient were reviewed by me and considered in my medical decision making (see chart for details).    MDM Rules/Calculators/A&P                     Patient will be given treatment with Carafate for home and advised on need to contact her GI specialist as it is possible.  The patient agrees the plan and all questions were answered.  Patient is advised to return here for any worsening in her condition.  At this point I do not have a definitive diagnosis is what is causing her abdominal pain the CT scan of her abdomen does not yield any abnormality at this time. Final Clinical Impression(s) / ED Diagnoses Final diagnoses:  None    Rx / DC Orders ED Discharge Orders    None       Charlestine Night, PA-C 12/09/19 2019    Virgina Norfolk, DO 12/09/19 2027

## 2019-12-09 NOTE — ED Notes (Signed)
Pt had an endoscopy on 2/16, states something was clipped, c/o abdominal pain and nausea since. Also c/o diarrhea.

## 2019-12-09 NOTE — Discharge Instructions (Addendum)
You will need to see your GI doctor soon as possible.  Return here as needed.  Increase your fluid intake.  Your CT scan and laboratory testing other than having slightly lower potassium did not show any significant abnormalities.

## 2019-12-09 NOTE — ED Notes (Signed)
Pt given 2 bottles oral contrast to drink in the next 2 hours for CT scan  CT scan approximate time due at 1930

## 2019-12-09 NOTE — ED Triage Notes (Signed)
Chronic abdominal pain.  Pt has been seen and treated for same with pending colonoscopy results. Reports nausea without vomiting.

## 2019-12-09 NOTE — ED Notes (Signed)
Patient transported to CT 

## 2020-02-15 ENCOUNTER — Encounter (HOSPITAL_BASED_OUTPATIENT_CLINIC_OR_DEPARTMENT_OTHER): Payer: Self-pay | Admitting: Emergency Medicine

## 2020-02-15 ENCOUNTER — Other Ambulatory Visit: Payer: Self-pay

## 2020-02-15 ENCOUNTER — Emergency Department (HOSPITAL_BASED_OUTPATIENT_CLINIC_OR_DEPARTMENT_OTHER)
Admission: EM | Admit: 2020-02-15 | Discharge: 2020-02-15 | Disposition: A | Discharge status: Home / Self Care | Payer: Medicaid Other | Attending: Emergency Medicine | Admitting: Emergency Medicine

## 2020-02-15 DIAGNOSIS — Z8616 Personal history of COVID-19: Secondary | ICD-10-CM | POA: Diagnosis not present

## 2020-02-15 DIAGNOSIS — I10 Essential (primary) hypertension: Secondary | ICD-10-CM | POA: Diagnosis not present

## 2020-02-15 DIAGNOSIS — N3001 Acute cystitis with hematuria: Secondary | ICD-10-CM | POA: Diagnosis not present

## 2020-02-15 DIAGNOSIS — F17210 Nicotine dependence, cigarettes, uncomplicated: Secondary | ICD-10-CM | POA: Insufficient documentation

## 2020-02-15 DIAGNOSIS — Z79899 Other long term (current) drug therapy: Secondary | ICD-10-CM | POA: Insufficient documentation

## 2020-02-15 DIAGNOSIS — R300 Dysuria: Secondary | ICD-10-CM | POA: Diagnosis present

## 2020-02-15 LAB — URINALYSIS, MICROSCOPIC (REFLEX): WBC, UA: >50 WBC/hpf (ref 0–5)

## 2020-02-15 LAB — URINALYSIS, ROUTINE W REFLEX MICROSCOPIC
Glucose, UA: NEGATIVE mg/dL
Ketones, ur: NEGATIVE mg/dL
Nitrite: NEGATIVE
Protein, ur: NEGATIVE mg/dL
Specific Gravity, Urine: >1.03 — ABNORMAL HIGH (ref 1.005–1.030)
pH: 6 (ref 5.0–8.0)

## 2020-02-15 MED ORDER — CEPHALEXIN 500 MG PO CAPS: 500.0000 mg | ORAL_CAPSULE | Freq: Two times a day (BID) | ORAL | 0 refills | Status: AC

## 2020-02-15 MED ORDER — CEPHALEXIN 250 MG PO CAPS
500.0000 mg | ORAL_CAPSULE | Freq: Once | ORAL | Status: AC
  Administered 2020-02-15: 08:00:00 500 mg via ORAL
  Filled 2020-02-15: qty 2

## 2020-02-15 MED FILL — CEPHALEXIN 500 MG CAPSULE: 500 | 7 days supply | Qty: 14 | Fill #0

## 2020-02-15 NOTE — ED Notes (Signed)
Pt verbalized understanding of dc instructions.

## 2020-02-15 NOTE — ED Triage Notes (Signed)
  Patient comes in with possible UTI.  States she has had burning pain when urinating for the last week. Denies any discharge.  Says urine is really concentrated and smells odorous.  Pain 9/10.

## 2020-02-15 NOTE — ED Provider Notes (Signed)
[facility] EMERGENCY DEPARTMENT Provider Note   CSN: [id number] Arrival date & time: 02/15/20  8115     History Chief Complaint  Patient presents with  . Urinary Tract Infection    Theresa Miller is a 49 y.o. female presented to the emergency department with dysuria for the past several days.  She has been about a week where she has noticed malodorous urine and burning sensation with cramping when she tries to urinate.  This only occurs with urination.  She says her last UTI was many years ago.  She says she been drinking cranberry juice for few days which seemed to help but then her pain returned.  Says she is also drinking a lot of beer not a lot of water for the past week because "it is my birthday this week".  She denies any vaginal discharge or itching.  She does not think she has a yeast infection.  She denies any fevers or chills.  She denies nausea or vomiting.  She has no antibiotic allergies.  HPI     Past Medical History:  Diagnosis Date  . Arthritis   . Chronic pain   . COVID-19   . Hypertension     There are no problems to display for this patient.   Past Surgical History:  Procedure Laterality Date  . ABDOMINAL HYSTERECTOMY       OB History   No obstetric history on file.     History reviewed. No pertinent family history.  Social History   Tobacco Use  . Smoking status: Current Every Day Smoker    Packs/day: 1.00    Types: Cigarettes  . Smokeless tobacco: Never Used  Substance Use Topics  . Alcohol use: Yes    Comment: occ  . Drug use: No    Home Medications Prior to Admission medications   Medication Sig Start Date End Date Taking? Authorizing Provider  allopurinol (ZYLOPRIM) 100 MG tablet Take 100 mg by mouth daily.    [provider]  benzonatate (TESSALON) 100 MG capsule Take 1 capsule (100 mg total) by mouth every 8 (eight) hours. 11/17/19   Hyman Hopes, Margaux, PA-C  cephALEXin (KEFLEX) 500 MG capsule Take 1 capsule  (500 mg total) by mouth 2 (two) times daily for 7 days. 02/15/20 02/22/20  Terald Sleeper, MD  cetirizine (ZYRTEC) 10 MG tablet Take 10 mg by mouth daily.    [provider]  cyclobenzaprine (FLEXERIL) 10 MG tablet Take 10 mg by mouth 3 (three) times daily as needed for muscle spasms.    [provider]  diclofenac (VOLTAREN) 75 MG EC tablet Take 75 mg by mouth 2 (two) times daily.    [provider]  esomeprazole (NEXIUM) 40 MG capsule Take 40 mg by mouth daily at 12 noon.    [provider]  fluticasone (FLONASE) 50 MCG/ACT nasal spray Place 2 sprays into both nostrils daily.    [provider]  hydrochlorothiazide (HYDRODIURIL) 25 MG tablet Take 25 mg by mouth daily.    [provider]  meclizine (ANTIVERT) 12.5 MG tablet Take 1 tablet (12.5 mg total) by mouth 3 (three) times daily as needed for dizziness. 07/28/19   Long, Arlyss Repress, MD  meloxicam (MOBIC) 15 MG tablet Take 15 mg by mouth daily.    [provider]  ondansetron (ZOFRAN ODT) 4 MG disintegrating tablet Take 1 tablet (4 mg total) by mouth every 6 (six) hours as needed for nausea or vomiting. 01/26/19   Ward,  Kristen N, DO  ondansetron (ZOFRAN) 4 MG tablet Take 1 tablet (4 mg total) by mouth every 6 (six) hours. 11/17/19   Alroy Bailiff, Margaux, PA-C  oxycodone (OXY-IR) 5 MG capsule Take 5 mg by mouth 3 (three) times daily as needed.    [provider]  pantoprazole (PROTONIX) 40 MG tablet Take by mouth. 11/30/19 12/30/19  [provider]  potassium chloride (KLOR-CON) 10 MEQ tablet Take 1 tablet (10 mEq total) by mouth daily for 5 days. 11/17/19 11/22/19  Alroy Bailiff, Margaux, PA-C  sucralfate (CARAFATE) 1 GM/10ML suspension Take 10 mLs (1 g total) by mouth 4 (four) times daily -  with meals and at bedtime. 12/09/19   Lawyer, Harrell Gave, PA-C  valsartan (DIOVAN) 80 MG tablet Take 80 mg by mouth daily.    [provider]    Allergies    Patient has no known  allergies.  Review of Systems   Review of Systems  Constitutional: Negative for chills and fever.  Respiratory: Negative for cough and shortness of breath.   Cardiovascular: Negative for chest pain and palpitations.  Gastrointestinal: Positive for abdominal pain. Negative for vomiting.  Genitourinary: Positive for dysuria. Negative for flank pain, hematuria, vaginal bleeding and vaginal discharge.  Skin: Negative for rash and wound.  Neurological: Negative for syncope and light-headedness.  Psychiatric/Behavioral: Negative for agitation and confusion.  All other systems reviewed and are negative.   Physical Exam Updated Vital Signs BP (!) 125/101 (BP Location: Right Arm)   Pulse 91   Temp 98.2 F (36.8 C) (Oral)   Resp 18   Ht 5\' 4"  (1.626 m)   Wt 100.7 kg   SpO2 100%   BMI 38.11 kg/m   Physical Exam Vitals and nursing note reviewed.  Constitutional:      General: She is not in acute distress.    Appearance: She is well-developed.  HENT:     Head: Normocephalic and atraumatic.  Eyes:     Conjunctiva/sclera: Conjunctivae normal.  Cardiovascular:     Rate and Rhythm: Normal rate and regular rhythm.  Pulmonary:     Effort: Pulmonary effort is normal. No respiratory distress.  Abdominal:     General: There is no distension.     Palpations: Abdomen is soft.  Musculoskeletal:     Cervical back: Normal range of motion and neck supple.  Skin:    General: Skin is warm and dry.  Neurological:     General: No focal deficit present.     Mental Status: She is alert and oriented to person, place, and time.  Psychiatric:        Mood and Affect: Mood normal.        Behavior: Behavior normal.     ED Results / Procedures / Treatments   Labs (all labs ordered are listed, but only abnormal results are displayed) Labs Reviewed  URINALYSIS, ROUTINE W REFLEX MICROSCOPIC - Abnormal; Notable for the following components:      Result Value   APPearance CLOUDY (*)    Specific  Gravity, Urine >1.030 (*)    Hgb urine dipstick MODERATE (*)    Bilirubin Urine SMALL (*)    Leukocytes,Ua SMALL (*)    All other components within normal limits  URINALYSIS, MICROSCOPIC (REFLEX) - Abnormal; Notable for the following components:   Bacteria, UA MANY (*)    All other components within normal limits  URINE CULTURE    EKG None  Radiology No results found.  Procedures Procedures (including critical care time)  Medications Ordered in ED Medications  cephALEXin (KEFLEX) capsule 500 mg (500 mg Oral Given 02/15/20 0750)    ED Course  I have reviewed the triage vital signs and the nursing notes.  Pertinent labs & imaging results that were available during my care of the patient were reviewed by me and considered in my medical decision making (see chart for details).  49 yo female here with dysuria for 2-3 days. Per history I suspect this is cystitis No evidence of pyelonephritis or sepsis on my exam No report of vaginal itching or discharge to suggest yeast infection, BV, or pelvic infection.  Her symptoms only occur with urination  UA shows some likely concentration and dehydration.  I advised that she needs to drink a lot more water.  We'll treat based on her symptomatology while awaiting urine culture results with Keflex.  She verbalizes understanding.    Final Clinical Impression(s) / ED Diagnoses Final diagnoses:  Acute cystitis with hematuria    Rx / DC Orders ED Discharge Orders         Ordered    cephALEXin (KEFLEX) 500 MG capsule  2 times daily     02/15/20 0747           Terald Sleeper, MD 02/15/20 [phone]

## 2020-02-15 NOTE — Discharge Instructions (Signed)
Please follow-up with your primary care provider in 1 week to ensure that your symptoms have resolved.

## 2020-02-17 LAB — URINE CULTURE: Culture: 70000 — AB

## 2020-02-18 ENCOUNTER — Telehealth: Payer: Self-pay | Admitting: Emergency Medicine

## 2020-02-18 NOTE — Telephone Encounter (Signed)
Post ED Visit - Positive Culture Follow-up  Culture report reviewed by antimicrobial stewardship pharmacist: [facility] []  , Pharm.D. []  Enzo Bi, Pharm.D., BCPS AQ-ID []  , Pharm.D., BCPS []  Celedonio Miyamoto, Pharm.D., BCPS []  [place], Garvin Fila.D., BCPS, AAHIVP []  , Pharm.D., BCPS, AAHIVP []  Georgina Pillion, PharmD, BCPS []  , PharmD, BCPS []  [place], PharmD, BCPS []  [address], PharmD []  , PharmD, BCPS [x]  Estella Husk, PharmD  Pharmacy Team []  Lysle Pearl, PharmD []  , PharmD []  Phillips Climes, PharmD []  , Rph []  Agapito Games) , PharmD []  Verlan Friends, PharmD []  , PharmD []  Mervyn Gay, PharmD []  , PharmD []  Vinnie Level, PharmD []  [facility], PharmD []  , PharmD []  Len Childs, PharmD   Positive urine culture Treated with Cephalexin, organism sensitive to the same and no further patient follow-up is required at this time.  Theresa Miller 02/18/2020, 11:29 AM

## 2020-06-20 ENCOUNTER — Emergency Department (HOSPITAL_BASED_OUTPATIENT_CLINIC_OR_DEPARTMENT_OTHER)
Admission: EM | Admit: 2020-06-20 | Discharge: 2020-06-20 | Disposition: A | Discharge status: Home / Self Care | Payer: Medicaid Other

## 2020-06-20 ENCOUNTER — Ambulatory Visit (HOSPITAL_BASED_OUTPATIENT_CLINIC_OR_DEPARTMENT_OTHER): Admission: RE | Admit: 2020-06-20 | Payer: Medicaid Other | Source: Ambulatory Visit

## 2020-06-20 ENCOUNTER — Emergency Department (HOSPITAL_BASED_OUTPATIENT_CLINIC_OR_DEPARTMENT_OTHER): Payer: Medicaid Other

## 2020-06-20 ENCOUNTER — Other Ambulatory Visit: Payer: Self-pay

## 2020-06-20 DIAGNOSIS — R059 Cough, unspecified: Secondary | ICD-10-CM

## 2021-01-01 ENCOUNTER — Encounter (HOSPITAL_BASED_OUTPATIENT_CLINIC_OR_DEPARTMENT_OTHER): Payer: Self-pay

## 2021-01-01 ENCOUNTER — Emergency Department (HOSPITAL_BASED_OUTPATIENT_CLINIC_OR_DEPARTMENT_OTHER)
Admission: EM | Admit: 2021-01-01 | Discharge: 2021-01-01 | Disposition: A | Discharge status: Home / Self Care | Payer: Medicaid Other | Attending: Emergency Medicine | Admitting: Emergency Medicine

## 2021-01-01 ENCOUNTER — Other Ambulatory Visit: Payer: Self-pay

## 2021-01-01 DIAGNOSIS — F17210 Nicotine dependence, cigarettes, uncomplicated: Secondary | ICD-10-CM | POA: Insufficient documentation

## 2021-01-01 DIAGNOSIS — Z8616 Personal history of COVID-19: Secondary | ICD-10-CM | POA: Diagnosis not present

## 2021-01-01 DIAGNOSIS — Z79899 Other long term (current) drug therapy: Secondary | ICD-10-CM | POA: Insufficient documentation

## 2021-01-01 DIAGNOSIS — H5712 Ocular pain, left eye: Secondary | ICD-10-CM | POA: Diagnosis not present

## 2021-01-01 DIAGNOSIS — R1013 Epigastric pain: Secondary | ICD-10-CM | POA: Diagnosis not present

## 2021-01-01 DIAGNOSIS — R42 Dizziness and giddiness: Secondary | ICD-10-CM | POA: Diagnosis present

## 2021-01-01 DIAGNOSIS — I10 Essential (primary) hypertension: Secondary | ICD-10-CM | POA: Insufficient documentation

## 2021-01-01 DIAGNOSIS — R110 Nausea: Secondary | ICD-10-CM | POA: Diagnosis not present

## 2021-01-01 LAB — URINALYSIS, ROUTINE W REFLEX MICROSCOPIC
Bilirubin Urine: NEGATIVE
Glucose, UA: NEGATIVE mg/dL
Ketones, ur: NEGATIVE mg/dL
Leukocytes,Ua: NEGATIVE
Nitrite: NEGATIVE
Protein, ur: NEGATIVE mg/dL
Specific Gravity, Urine: 1.025 (ref 1.005–1.030)
pH: 6 (ref 5.0–8.0)

## 2021-01-01 LAB — CBC WITH DIFFERENTIAL/PLATELET
Abs Immature Granulocytes: 0.02 10*3/uL (ref 0.00–0.07)
Basophils Absolute: 0 10*3/uL (ref 0.0–0.1)
Basophils Relative: 0 %
Eosinophils Absolute: 0.1 10*3/uL (ref 0.0–0.5)
Eosinophils Relative: 1 %
HCT: 38.9 % (ref 36.0–46.0)
Hemoglobin: 12.9 g/dL (ref 12.0–15.0)
Immature Granulocytes: 0 %
Lymphocytes Relative: 36 %
Lymphs Abs: 2.5 10*3/uL (ref 0.7–4.0)
MCH: 29.7 pg (ref 26.0–34.0)
MCHC: 33.2 g/dL (ref 30.0–36.0)
MCV: 89.4 fL (ref 80.0–100.0)
Monocytes Absolute: 0.4 10*3/uL (ref 0.1–1.0)
Monocytes Relative: 6 %
Neutro Abs: 3.9 10*3/uL (ref 1.7–7.7)
Neutrophils Relative %: 57 %
Platelets: 296 10*3/uL (ref 150–400)
RBC: 4.35 MIL/uL (ref 3.87–5.11)
RDW: 13.9 % (ref 11.5–15.5)
WBC: 7 10*3/uL (ref 4.0–10.5)
nRBC: 0 % (ref 0.0–0.2)

## 2021-01-01 LAB — URINALYSIS, MICROSCOPIC (REFLEX)

## 2021-01-01 LAB — COMPREHENSIVE METABOLIC PANEL
ALT: 16 U/L (ref 0–44)
AST: 20 U/L (ref 15–41)
Albumin: 4 g/dL (ref 3.5–5.0)
Alkaline Phosphatase: 52 U/L (ref 38–126)
Anion gap: 10 (ref 5–15)
BUN: 24 mg/dL — ABNORMAL HIGH (ref 6–20)
CO2: 27 mmol/L (ref 22–32)
Calcium: 9 mg/dL (ref 8.9–10.3)
Chloride: 100 mmol/L (ref 98–111)
Creatinine, Ser: 0.85 mg/dL (ref 0.44–1.00)
GFR, Estimated: >60 mL/min (ref >60)
Glucose, Bld: 110 mg/dL — ABNORMAL HIGH (ref 70–99)
Potassium: 3.2 mmol/L — ABNORMAL LOW (ref 3.5–5.1)
Sodium: 137 mmol/L (ref 135–145)
Total Bilirubin: 0.3 mg/dL (ref 0.3–1.2)
Total Protein: 7 g/dL (ref 6.5–8.1)

## 2021-01-01 LAB — LIPASE, BLOOD: Lipase: 28 U/L (ref 11–51)

## 2021-01-01 MED ORDER — ONDANSETRON HCL 4 MG/2ML IJ SOLN
4.0000 mg | Freq: Once | INTRAMUSCULAR | Status: AC
  Administered 2021-01-01: 4 mg via INTRAVENOUS
  Filled 2021-01-01: qty 2

## 2021-01-01 MED ORDER — SUCRALFATE 1 GM/10ML PO SUSP: 1.0000 g | Freq: Three times a day (TID) | ORAL | 0 refills | Status: AC

## 2021-01-01 MED ORDER — SODIUM CHLORIDE 0.9 % IV SOLN: INTRAVENOUS | Status: DC | PRN

## 2021-01-01 MED ORDER — POTASSIUM CHLORIDE CRYS ER 20 MEQ PO TBCR
40.0000 meq | EXTENDED_RELEASE_TABLET | Freq: Once | ORAL | Status: AC
  Administered 2021-01-01: 40 meq via ORAL
  Filled 2021-01-01: qty 2

## 2021-01-01 MED ORDER — ONDANSETRON 4 MG PO TBDP: 4.0000 mg | ORAL_TABLET | Freq: Three times a day (TID) | ORAL | 0 refills | Status: AC | PRN

## 2021-01-01 MED ORDER — SODIUM CHLORIDE 0.9 % IV BOLUS
1000.0000 mL | Freq: Once | INTRAVENOUS | Status: AC
  Administered 2021-01-01: 1000 mL via INTRAVENOUS

## 2021-01-01 MED ORDER — FAMOTIDINE IN NACL 20-0.9 MG/50ML-% IV SOLN
20.0000 mg | Freq: Once | INTRAVENOUS | Status: AC
  Administered 2021-01-01: 20 mg via INTRAVENOUS
  Filled 2021-01-01: qty 50

## 2021-01-01 MED ORDER — PANTOPRAZOLE SODIUM 40 MG PO TBEC: 40.0000 mg | DELAYED_RELEASE_TABLET | Freq: Every day | ORAL | 0 refills | Status: AC

## 2021-01-01 NOTE — ED Triage Notes (Signed)
Pt c/o epigastric pain, n/d x 1 week-also c/o pain to left eye x 3-4 weeks and "feeling foggy headed" x today-NAD-steady gait

## 2021-01-01 NOTE — ED Notes (Signed)
Pt aware of need for urine specimen.  Lying flat for orthostatic vs

## 2021-01-01 NOTE — Discharge Instructions (Addendum)
Your work-up today was reassuring, as we discussed I want you to follow-up with neurology.  Please schedule appointment with them as soon as you can.  If you have any new worsening concerning symptom please come back to the emergency department.  In regards to your stomach pain, I want you to stop taking Goody's powder.  If this pain becomes worse or comes back please come back here.  Make sure to stay hydrated, continue to take your pantoprazole and your Carafate, did prescribe you more nausea medication that you can take as needed.  Get help right away if: You vomit or have diarrhea and are unable to eat or drink anything. You have problems talking, walking, swallowing, or using your arms, hands, or legs. You feel generally weak. You are not thinking clearly or you have trouble forming sentences. It may take a friend or family member to notice this. You have chest pain, abdominal pain, shortness of breath, or sweating. Your vision changes. You have any bleeding. You have a severe headache. You have neck pain or a stiff neck. You have a fever.

## 2021-01-01 NOTE — ED Provider Notes (Signed)
[facility] EMERGENCY DEPARTMENT Provider Note   CSN: [id number] Arrival date & time: 01/01/21  1756     History Chief Complaint  Patient presents with  . Abdominal Pain    Theresa Miller is a 50 y.o. female with past medical history of hypertension and chronic pain that presents the emergency department today for chief complaint of feeling foggy headed.  Patient states that she has episodes of feeling foggy headed for a while now, she states that she has seen a neurologist however I am not able to see this in her notes.  States that foggy headed feels like a lightheadedness, no true dizziness or vertigo-like symptoms.  States that she feels as if a part of her" brain is missing."  Denies any confusion, slurred speech, facial droop, neglect.  States that she is unable to describe the feeling, however feels as if she is something is wrong with her head.  Denies any sensation changes to her head.  Denies any headache.  States that she has some eye pain for a month now on the left side, is not currently having it, is currently being worked up by her PCP with this.  No vision changes.  Also admits to some nausea and epigastric pain for the past week.  States that she has been taking 3 packs of Goody powders daily for multiple months due to her chronic pain in her bones.  Denies any blood thinners.  Denies any regurgitation, cough.  Denies any hematemesis or vomiting.  Denies any diarrhea although triage note states that she has diarrhea.  Denies any back pain, chest pain, shortness of breath.  Denies any numbness or tingling or weakness.  Denies any abnormal gait.  Denies any fevers chills or URI symptoms.  No other complaints. Per chart review patient has had this epigastric pain before with associated nausea, had CT abdomen done a year ago which was negative for acute intra-abdominal pathology.  HPI     Past Medical History:  Diagnosis Date  . Arthritis   . Chronic pain   . COVID-19    . Hypertension     There are no problems to display for this patient.   Past Surgical History:  Procedure Laterality Date  . ABDOMINAL HYSTERECTOMY       OB History   No obstetric history on file.     No family history on file.  Social History   Tobacco Use  . Smoking status: Current Every Day Smoker    Packs/day: 1.00    Types: Cigarettes  . Smokeless tobacco: Never Used  Vaping Use  . Vaping Use: Never used  Substance Use Topics  . Alcohol use: Not Currently  . Drug use: No    Home Medications Prior to Admission medications   Medication Sig Start Date End Date Taking? Authorizing Provider  ondansetron (ZOFRAN ODT) 4 MG disintegrating tablet Take 1 tablet (4 mg total) by mouth every 8 (eight) hours as needed for nausea or vomiting. 01/01/21  Yes Farrel Gordon, PA-C  allopurinol (ZYLOPRIM) 100 MG tablet Take 100 mg by mouth daily.    [provider]  benzonatate (TESSALON) 100 MG capsule Take 1 capsule (100 mg total) by mouth every 8 (eight) hours. 11/17/19   Hyman Hopes, Margaux, PA-C  cetirizine (ZYRTEC) 10 MG tablet Take 10 mg by mouth daily.    [provider]  cyclobenzaprine (FLEXERIL) 10 MG tablet Take 10 mg by mouth 3 (three) times daily as needed for muscle spasms.  [provider]  diclofenac (VOLTAREN) 75 MG EC tablet Take 75 mg by mouth 2 (two) times daily.    [provider]  esomeprazole (NEXIUM) 40 MG capsule Take 40 mg by mouth daily at 12 noon.    [provider]  fluticasone (FLONASE) 50 MCG/ACT nasal spray Place 2 sprays into both nostrils daily.    [provider]  hydrochlorothiazide (HYDRODIURIL) 25 MG tablet Take 25 mg by mouth daily.    [provider]  meclizine (ANTIVERT) 12.5 MG tablet Take 1 tablet (12.5 mg total) by mouth 3 (three) times daily as needed for dizziness. 07/28/19   Long, Arlyss Repress, MD  meloxicam (MOBIC) 15 MG tablet Take 15 mg by mouth daily.    [provider]   ondansetron (ZOFRAN) 4 MG tablet Take 1 tablet (4 mg total) by mouth every 6 (six) hours. 11/17/19   Hyman Hopes, Margaux, PA-C  oxycodone (OXY-IR) 5 MG capsule Take 5 mg by mouth 3 (three) times daily as needed.    [provider]  pantoprazole (PROTONIX) 40 MG tablet Take 1 tablet (40 mg total) by mouth daily. 01/01/21 01/31/21  Farrel Gordon, PA-C  potassium chloride (KLOR-CON) 10 MEQ tablet Take 1 tablet (10 mEq total) by mouth daily for 5 days. 11/17/19 11/22/19  Hyman Hopes, Margaux, PA-C  sucralfate (CARAFATE) 1 GM/10ML suspension Take 10 mLs (1 g total) by mouth 4 (four) times daily -  with meals and at bedtime. 01/01/21   Farrel Gordon, PA-C  valsartan (DIOVAN) 80 MG tablet Take 80 mg by mouth daily.    [provider]    Allergies    Patient has no known allergies.  Review of Systems   Review of Systems  Constitutional: Negative for chills, diaphoresis, fatigue and fever.  HENT: Negative for congestion, sore throat and trouble swallowing.   Eyes: Negative for pain and visual disturbance.  Respiratory: Negative for cough, shortness of breath and wheezing.   Cardiovascular: Negative for chest pain, palpitations and leg swelling.  Gastrointestinal: Positive for abdominal pain and nausea. Negative for abdominal distention, diarrhea and vomiting.  Genitourinary: Negative for difficulty urinating.  Musculoskeletal: Negative for back pain, neck pain and neck stiffness.  Skin: Negative for pallor.  Neurological: Positive for light-headedness. Negative for dizziness, tremors, seizures, syncope, facial asymmetry, speech difficulty, weakness, numbness and headaches.  Psychiatric/Behavioral: Negative for confusion.    Physical Exam Updated Vital Signs BP (!) 131/94 (BP Location: Left Arm)   Pulse 79   Temp 98.1 F (36.7 C) (Oral)   Resp 18   Ht 5\' 4"  (1.626 m)   Wt 103.4 kg   SpO2 99%   BMI 39.13 kg/m   Physical Exam Constitutional:      General: She is not in acute  distress.    Appearance: Normal appearance. She is not ill-appearing, toxic-appearing or diaphoretic.  HENT:     Mouth/Throat:     Mouth: Mucous membranes are moist.     Pharynx: Oropharynx is clear.  Eyes:     General: No scleral icterus.    Extraocular Movements: Extraocular movements intact.     Pupils: Pupils are equal, round, and reactive to light.  Cardiovascular:     Rate and Rhythm: Normal rate and regular rhythm.     Pulses: Normal pulses.     Heart sounds: Normal heart sounds.  Pulmonary:     Effort: Pulmonary effort is normal. No respiratory distress.     Breath sounds: Normal breath sounds. No stridor. No wheezing,  rhonchi or rales.  Chest:     Chest wall: No tenderness.  Abdominal:     General: Abdomen is flat. There is no distension.     Palpations: Abdomen is soft.     Tenderness: There is no abdominal tenderness. There is no guarding or rebound.    Musculoskeletal:        General: No swelling or tenderness. Normal range of motion.     Cervical back: Normal range of motion and neck supple. No rigidity.     Right lower leg: No edema.     Left lower leg: No edema.  Skin:    General: Skin is warm and dry.     Capillary Refill: Capillary refill takes less than 2 seconds.     Coloration: Skin is not pale.  Neurological:     General: No focal deficit present.     Mental Status: She is alert and oriented to person, place, and time.     Comments: Alert. Clear speech. No facial droop. CNIII-XII grossly intact. Bilateral upper and lower extremities' sensation grossly intact. 5/5 symmetric strength with grip strength and with plantar and dorsi flexion bilaterally. Normal finger to nose bilaterally. Negative pronator drift. Negative Romberg sign. Gait is steady and intact   Psychiatric:        Mood and Affect: Mood normal.        Behavior: Behavior normal.     ED Results / Procedures / Treatments   Labs (all labs ordered are listed, but only abnormal results are  displayed) Labs Reviewed  COMPREHENSIVE METABOLIC PANEL - Abnormal; Notable for the following components:      Result Value   Potassium 3.2 (*)    Glucose, Bld 110 (*)    BUN 24 (*)    All other components within normal limits  URINALYSIS, ROUTINE W REFLEX MICROSCOPIC - Abnormal; Notable for the following components:   Hgb urine dipstick TRACE (*)    All other components within normal limits  URINALYSIS, MICROSCOPIC (REFLEX) - Abnormal; Notable for the following components:   Bacteria, UA RARE (*)    All other components within normal limits  CBC WITH DIFFERENTIAL/PLATELET  LIPASE, BLOOD    EKG None  Radiology No results found.  Procedures Procedures   Medications Ordered in ED Medications  0.9 %  sodium chloride infusion ( Intravenous Stopped 01/01/21 2056)  sodium chloride 0.9 % bolus 1,000 mL ( Intravenous Stopped 01/01/21 2101)  famotidine (PEPCID) IVPB 20 mg premix ( Intravenous Stopped 01/01/21 2018)  ondansetron (ZOFRAN) injection 4 mg (4 mg Intravenous Given 01/01/21 1944)  potassium chloride SA (KLOR-CON) CR tablet 40 mEq (40 mEq Oral Given 01/01/21 2020)    ED Course  I have reviewed the triage vital signs and the nursing notes.  Pertinent labs & imaging results that were available during my care of the patient were reviewed by me and considered in my medical decision making (see chart for details).    MDM Rules/Calculators/A&P                         Sriya Kroeze is a 50 y.o. female with past medical history of hypertension and chronic pain that presents the emergency department today for chief complaint of feeling foggy headed.  Normal neuro exam.  Patient appears stable.  Do not think that patient needs CT scan of the head at this time, patient has no vertigo.  No true dizziness.  No presyncopal episode. Orthostatics negative.  States that she has had this for multiple months, comes and go.   In regards to epigastric pain, this is most likely from multiple  months of Goody's powder, most likely has gastritis. No hematemsis with no bloody bowel movents.  Discussed that patient should stop using Goody's powder, will treat with GI cocktail and Pepcid and reevaluate.  CBC unremarkable, no leukocytosis.  CMP with potassium of 3.2, did replete here.  Otherwise no abnormalities.  Urinalysis and lipase normal.  Upon reevaluation after GI cocktail, patient states that she feels much better.  Epigastric pain has completely resolved.  Upon palpation of abdomen, patient is not having any more tenderness.  Do not think that we need to obtain CT scan, shared decision making with patient about this, patient agrees. Low concerns for GI bleed.  We will have patient follow-up with neurology, patient agreeable with plan.  Doubt need for further emergent work up at this time. I explained the diagnosis and have given explicit precautions to return to the ER including for any other new or worsening symptoms. The patient understands and accepts the medical plan as it's been dictated and I have answered their questions. Discharge instructions concerning home care and prescriptions have been given. The patient is STABLE and is discharged to home in good condition.  Final Clinical Impression(s) / ED Diagnoses Final diagnoses:  Lightheadedness  Epigastric pain    Rx / DC Orders ED Discharge Orders         Ordered    Ambulatory referral to Neurology       Comments: An appointment is requested in approximately: 1 week   01/01/21 2016    ondansetron (ZOFRAN ODT) 4 MG disintegrating tablet  Every 8 hours PRN        01/01/21 2026    pantoprazole (PROTONIX) 40 MG tablet  Daily        01/01/21 2052    sucralfate (CARAFATE) 1 GM/10ML suspension  3 times daily with meals & bedtime        01/01/21 2052           Farrel GordonPatel, Ling Flesch, PA-C 01/01/21 2319    Benjiman CorePickering, Nathan, MD 01/01/21 [phone]

## 2021-02-24 IMAGING — DX PORTABLE CHEST - 1 VIEW
1 series · 1 of 1 positions shown · non-contrast
Comparison: 01/07/2019

CLINICAL DATA: Nausea, shortness of breath, chills

EXAM:
PORTABLE CHEST 1 VIEW

[chest ap]
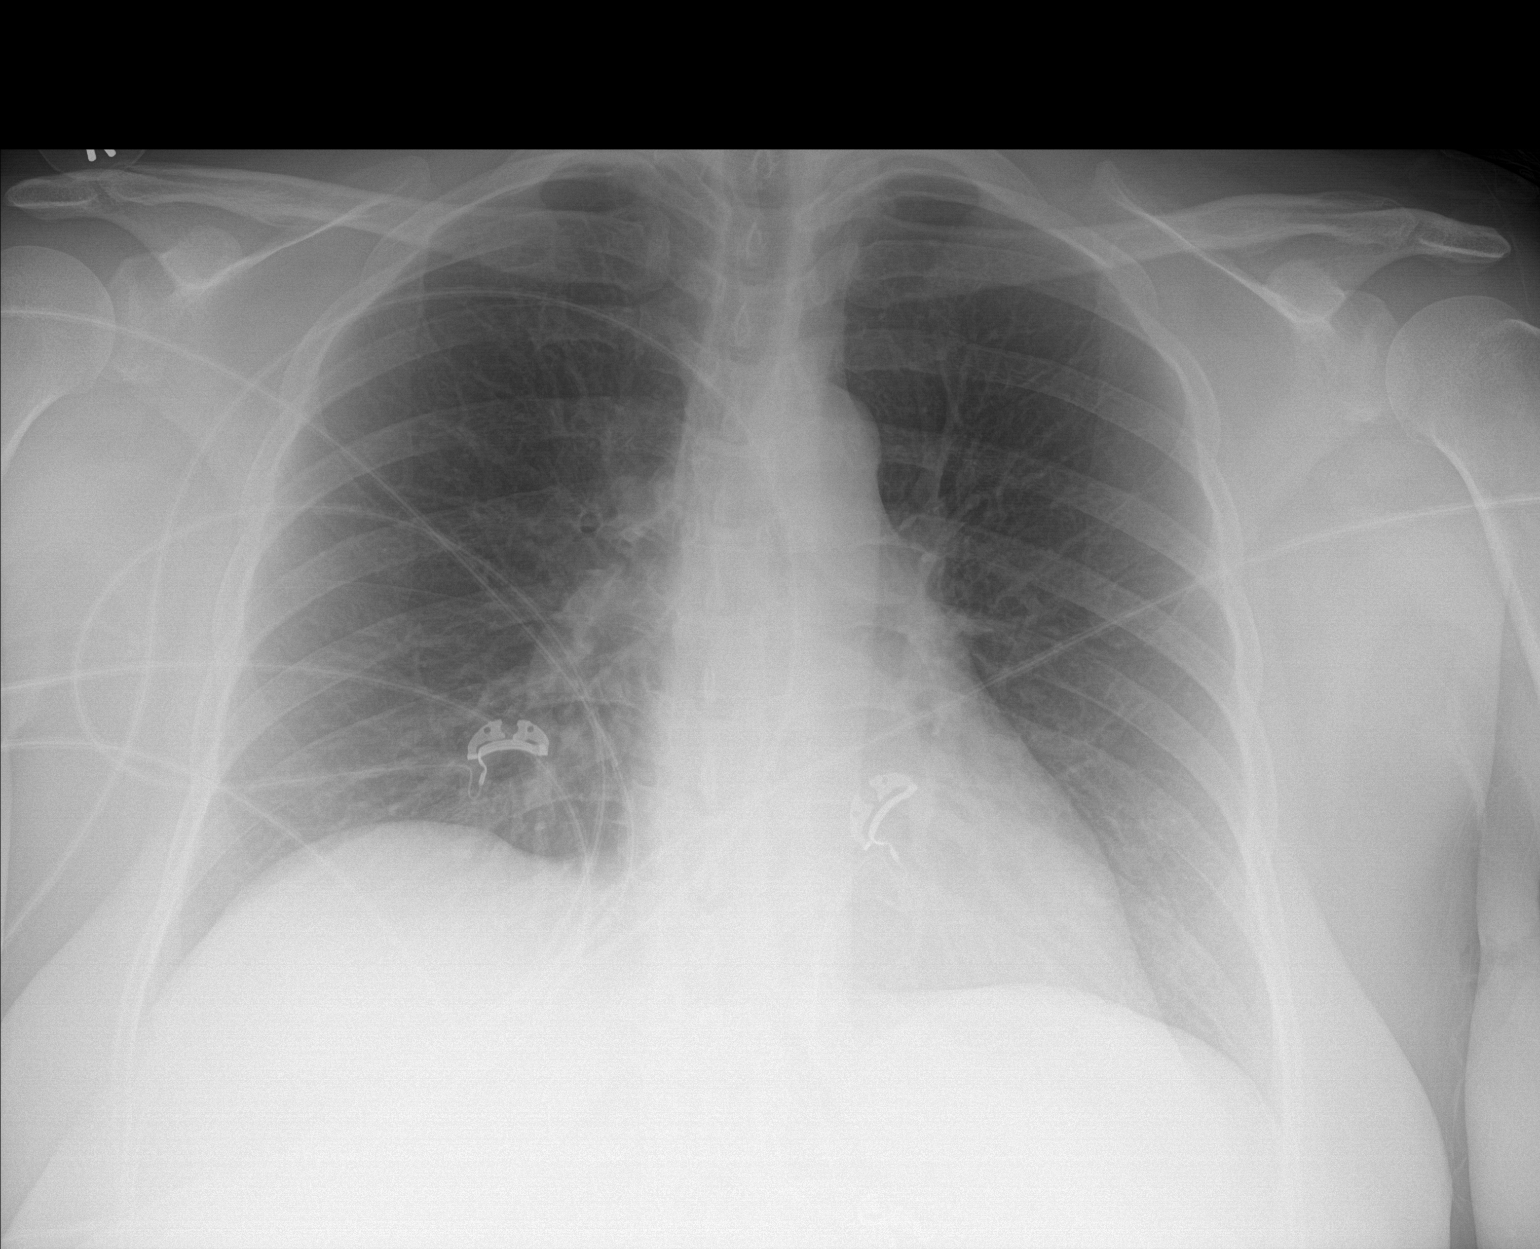

[1 of 1 positions shown; findings below may reference images not displayed]

FINDINGS: The heart size and mediastinal contours are within normal limits.
Both lungs are clear. The visualized skeletal structures are
unremarkable.
IMPRESSION: No acute abnormality of the lungs in AP portable projection.

## 2021-04-01 ENCOUNTER — Ambulatory Visit: Payer: Medicaid Other | Admitting: Neurology

## 2021-04-12 ENCOUNTER — Emergency Department (HOSPITAL_BASED_OUTPATIENT_CLINIC_OR_DEPARTMENT_OTHER)
Admission: EM | Admit: 2021-04-12 | Discharge: 2021-04-12 | Disposition: A | Discharge status: Home / Self Care | Payer: Medicaid Other | Attending: Emergency Medicine | Admitting: Emergency Medicine

## 2021-04-12 ENCOUNTER — Other Ambulatory Visit: Payer: Self-pay

## 2021-04-12 DIAGNOSIS — L049 Acute lymphadenitis, unspecified: Secondary | ICD-10-CM | POA: Diagnosis not present

## 2021-04-12 DIAGNOSIS — H9209 Otalgia, unspecified ear: Secondary | ICD-10-CM | POA: Insufficient documentation

## 2021-04-12 DIAGNOSIS — R0981 Nasal congestion: Secondary | ICD-10-CM | POA: Insufficient documentation

## 2021-04-12 DIAGNOSIS — Z79899 Other long term (current) drug therapy: Secondary | ICD-10-CM | POA: Diagnosis not present

## 2021-04-12 DIAGNOSIS — I10 Essential (primary) hypertension: Secondary | ICD-10-CM | POA: Insufficient documentation

## 2021-04-12 DIAGNOSIS — Z8616 Personal history of COVID-19: Secondary | ICD-10-CM | POA: Diagnosis not present

## 2021-04-12 DIAGNOSIS — F17210 Nicotine dependence, cigarettes, uncomplicated: Secondary | ICD-10-CM | POA: Insufficient documentation

## 2021-04-12 DIAGNOSIS — M542 Cervicalgia: Secondary | ICD-10-CM | POA: Diagnosis present

## 2021-04-12 DIAGNOSIS — I889 Nonspecific lymphadenitis, unspecified: Secondary | ICD-10-CM

## 2021-04-12 LAB — CBG MONITORING, ED: Glucose-Capillary: 116 mg/dL — ABNORMAL HIGH (ref 70–99)

## 2021-04-12 MED ORDER — AMOXICILLIN 500 MG PO TABS: 1000.0000 mg | ORAL_TABLET | Freq: Two times a day (BID) | ORAL | 0 refills | Status: AC

## 2021-04-12 NOTE — Discharge Instructions (Addendum)
Try to suck on sugar free hard candies in case this is a salivary duct stone. Please let your family doctor know how you are doing.  If your symptoms persist then they may want to do further imaging studies or refer you for a biopsy.  Please return to the emergency department for difficulty swallowing significantly worsening swelling or if you develop a fever.  We will try a course of antibiotics.

## 2021-04-12 NOTE — ED Provider Notes (Signed)
[facility] EMERGENCY DEPARTMENT Provider Note   CSN: [id number] Arrival date & time: 04/12/21  0754     History Chief Complaint  Patient presents with   Neck Pain    Theresa Miller is a 50 y.o. female.  50 yo F with a chief complaints of left-sided anterior neck pain just underneath the jaw swelling and pain that radiates up to the ear.  This been going on for about a month off and on.  Has seen her family doctor for it and reportedly had an MRI performed that was concerning for a sinus polyp.  She has been on allergy medicines and is worried because things are not improving.  She denies difficulty swallowing denies fevers.  She is also aware that her blood sugar is elevated and would like it checked here today.  The history is provided by the patient.  Neck Pain Associated symptoms: no chest pain, no fever and no headaches   Illness Severity:  Moderate Onset quality:  Gradual Duration:  1 month Timing:  Intermittent Progression:  Waxing and waning Chronicity:  New Associated symptoms: congestion and ear pain   Associated symptoms: no chest pain, no fever, no headaches, no myalgias, no nausea, no rhinorrhea, no shortness of breath, no vomiting and no wheezing       Past Medical History:  Diagnosis Date   Arthritis    Chronic pain    COVID-19    Hypertension     There are no problems to display for this patient.   Past Surgical History:  Procedure Laterality Date   ABDOMINAL HYSTERECTOMY       OB History   No obstetric history on file.     No family history on file.  Social History   Tobacco Use   Smoking status: Every Day    Packs/day: 1.00    Pack years: 0.00    Types: Cigarettes   Smokeless tobacco: Never  Vaping Use   Vaping Use: Never used  Substance Use Topics   Alcohol use: Not Currently   Drug use: No    Home Medications Prior to Admission medications   Medication Sig Start Date End Date Taking? Authorizing Provider   amoxicillin (AMOXIL) 500 MG tablet Take 2 tablets (1,000 mg total) by mouth 2 (two) times daily. 04/12/21  Yes Melene Plan, DO  allopurinol (ZYLOPRIM) 100 MG tablet Take 100 mg by mouth daily.    [provider]  benzonatate (TESSALON) 100 MG capsule Take 1 capsule (100 mg total) by mouth every 8 (eight) hours. 11/17/19   Hyman Hopes, Margaux, PA-C  cetirizine (ZYRTEC) 10 MG tablet Take 10 mg by mouth daily.    [provider]  cyclobenzaprine (FLEXERIL) 10 MG tablet Take 10 mg by mouth 3 (three) times daily as needed for muscle spasms.    [provider]  diclofenac (VOLTAREN) 75 MG EC tablet Take 75 mg by mouth 2 (two) times daily.    [provider]  esomeprazole (NEXIUM) 40 MG capsule Take 40 mg by mouth daily at 12 noon.    [provider]  fluticasone (FLONASE) 50 MCG/ACT nasal spray Place 2 sprays into both nostrils daily.    [provider]  hydrochlorothiazide (HYDRODIURIL) 25 MG tablet Take 25 mg by mouth daily.    [provider]  meclizine (ANTIVERT) 12.5 MG tablet Take 1 tablet (12.5 mg total) by mouth 3 (three) times daily as needed for dizziness. 07/28/19   Long, Arlyss Repress, MD  meloxicam [facility])  15 MG tablet Take 15 mg by mouth daily.    [provider]  ondansetron (ZOFRAN ODT) 4 MG disintegrating tablet Take 1 tablet (4 mg total) by mouth every 8 (eight) hours as needed for nausea or vomiting. 01/01/21   Farrel Gordon, PA-C  ondansetron (ZOFRAN) 4 MG tablet Take 1 tablet (4 mg total) by mouth every 6 (six) hours. 11/17/19   Hyman Hopes, Margaux, PA-C  oxycodone (OXY-IR) 5 MG capsule Take 5 mg by mouth 3 (three) times daily as needed.    [provider]  pantoprazole (PROTONIX) 40 MG tablet Take 1 tablet (40 mg total) by mouth daily. 01/01/21 01/31/21  Farrel Gordon, PA-C  potassium chloride (KLOR-CON) 10 MEQ tablet Take 1 tablet (10 mEq total) by mouth daily for 5 days. 11/17/19 11/22/19  Hyman Hopes, Margaux, PA-C  sucralfate  (CARAFATE) 1 GM/10ML suspension Take 10 mLs (1 g total) by mouth 4 (four) times daily -  with meals and at bedtime. 01/01/21   Farrel Gordon, PA-C  valsartan (DIOVAN) 80 MG tablet Take 80 mg by mouth daily.    [provider]    Allergies    Patient has no known allergies.  Review of Systems   Review of Systems  Constitutional:  Negative for chills and fever.  HENT:  Positive for congestion and ear pain. Negative for dental problem and rhinorrhea.   Eyes:  Negative for redness and visual disturbance.  Respiratory:  Negative for shortness of breath and wheezing.   Cardiovascular:  Negative for chest pain and palpitations.  Gastrointestinal:  Negative for nausea and vomiting.  Genitourinary:  Negative for dysuria and urgency.  Musculoskeletal:  Positive for neck pain. Negative for arthralgias and myalgias.  Skin:  Negative for pallor and wound.  Neurological:  Negative for dizziness and headaches.   Physical Exam Updated Vital Signs BP (!) 142/98 (BP Location: Right Arm)   Pulse 88   Temp 97.9 F (36.6 C)   Resp 18   Ht 5\' 4"  (1.626 m)   Wt 106.6 kg   SpO2 99%   BMI 40.34 kg/m   Physical Exam Vitals and nursing note reviewed.  Constitutional:      General: She is not in acute distress.    Appearance: She is well-developed. She is not diaphoretic.  HENT:     Head: Normocephalic and atraumatic.     Mouth/Throat:     Comments: No appreciable tonsillar swelling or exudates.  Mild posterior oropharyngeal nasal drip.  Swollen turbinates.  No obvious dental pathology.  She has an impacted left wisdom tooth clinically.  Left TM without effusion or bulging.  She has some identifiable swelling along the sternocleidomastoid.  No obvious palpable lymph node.  She points just underneath the angle of the jaw as her area of pain and swelling. Eyes:     Pupils: Pupils are equal, round, and reactive to light.  Cardiovascular:     Rate and Rhythm: Normal rate and regular rhythm.      Heart sounds: No murmur heard.   No friction rub. No gallop.  Pulmonary:     Effort: Pulmonary effort is normal.     Breath sounds: No wheezing or rales.  Abdominal:     General: There is no distension.     Palpations: Abdomen is soft.     Tenderness: There is no abdominal tenderness.  Musculoskeletal:        General: No tenderness.     Cervical back: Normal range of motion and neck supple.  Skin:    General: Skin is warm and dry.  Neurological:     Mental Status: She is alert and oriented to person, place, and time.  Psychiatric:        Behavior: Behavior normal.    ED Results / Procedures / Treatments   Labs (all labs ordered are listed, but only abnormal results are displayed) Labs Reviewed  CBG MONITORING, ED - Abnormal; Notable for the following components:      Result Value   Glucose-Capillary 116 (*)    All other components within normal limits    EKG None  Radiology No results found.  Procedures Procedures   Medications Ordered in ED Medications - No data to display  ED Course  I have reviewed the triage vital signs and the nursing notes.  Pertinent labs & imaging results that were available during my care of the patient were reviewed by me and considered in my medical decision making (see chart for details).    MDM Rules/Calculators/A&P                          50 yo F with a chief complaints of left-sided neck swelling pain.  Going on for about a month off and on.  Radiates to her left ear.  Could be inflammation due to a viral source, strange that lasted longer than 3 weeks.  Had been seen by her family doctor and thought to be allergy related.  We will try a course of antibiotics.  Based on the location she is describing though not appreciated clinically could be sialolithiasis.  We will have her do a trial of sugar free hard candy.  8:22 AM:  I have discussed the diagnosis/risks/treatment options with the patient and believe the pt to be eligible for  discharge home to follow-up with PCP. We also discussed returning to the ED immediately if new or worsening sx occur. We discussed the sx which are most concerning (e.g., sudden worsening pain, fever, inability to tolerate by mouth) that necessitate immediate return. Medications administered to the patient during their visit and any new prescriptions provided to the patient are listed below.  Medications given during this visit Medications - No data to display   The patient appears reasonably screen and/or stabilized for discharge and I doubt any other medical condition or other [facility] requiring further screening, evaluation, or treatment in the ED at this time prior to discharge.   Final Clinical Impression(s) / ED Diagnoses Final diagnoses:  Lymphadenitis    Rx / DC Orders ED Discharge Orders          Ordered    amoxicillin (AMOXIL) 500 MG tablet  2 times daily        04/12/21 0814             Melene Plan, DO 04/12/21 4709

## 2021-04-12 NOTE — ED Triage Notes (Signed)
Presents with left neck swelling with pain in left ear pain, saw primary MD and he told pt she had allergies.

## 2021-04-12 NOTE — ED Notes (Signed)
States she has been having some issues with her blood sugars, per her primary MD, her blood sugars have been running low. Will assess CBG at this time.

## 2021-04-12 NOTE — ED Notes (Signed)
ED Provider at bedside. 

## 2021-04-13 ENCOUNTER — Emergency Department (HOSPITAL_BASED_OUTPATIENT_CLINIC_OR_DEPARTMENT_OTHER)
Admission: EM | Admit: 2021-04-13 | Discharge: 2021-04-13 | Disposition: A | Discharge status: Home / Self Care | Payer: Medicaid Other | Attending: Emergency Medicine | Admitting: Emergency Medicine

## 2021-04-13 ENCOUNTER — Other Ambulatory Visit: Payer: Self-pay

## 2021-04-13 ENCOUNTER — Encounter (HOSPITAL_BASED_OUTPATIENT_CLINIC_OR_DEPARTMENT_OTHER): Payer: Self-pay | Admitting: Urology

## 2021-04-13 DIAGNOSIS — Z8616 Personal history of COVID-19: Secondary | ICD-10-CM | POA: Insufficient documentation

## 2021-04-13 DIAGNOSIS — F17210 Nicotine dependence, cigarettes, uncomplicated: Secondary | ICD-10-CM | POA: Insufficient documentation

## 2021-04-13 DIAGNOSIS — R0981 Nasal congestion: Secondary | ICD-10-CM | POA: Diagnosis not present

## 2021-04-13 DIAGNOSIS — Z79899 Other long term (current) drug therapy: Secondary | ICD-10-CM | POA: Insufficient documentation

## 2021-04-13 DIAGNOSIS — R5383 Other fatigue: Secondary | ICD-10-CM | POA: Diagnosis not present

## 2021-04-13 DIAGNOSIS — R519 Headache, unspecified: Secondary | ICD-10-CM | POA: Diagnosis not present

## 2021-04-13 DIAGNOSIS — R42 Dizziness and giddiness: Secondary | ICD-10-CM | POA: Insufficient documentation

## 2021-04-13 DIAGNOSIS — I10 Essential (primary) hypertension: Secondary | ICD-10-CM | POA: Diagnosis not present

## 2021-04-13 DIAGNOSIS — R531 Weakness: Secondary | ICD-10-CM | POA: Diagnosis not present

## 2021-04-13 LAB — CBC WITH DIFFERENTIAL/PLATELET
Abs Immature Granulocytes: 0.04 10*3/uL (ref 0.00–0.07)
Basophils Absolute: 0 10*3/uL (ref 0.0–0.1)
Basophils Relative: 0 %
Eosinophils Absolute: 0 10*3/uL (ref 0.0–0.5)
Eosinophils Relative: 1 %
HCT: 40.2 % (ref 36.0–46.0)
Hemoglobin: 13.3 g/dL (ref 12.0–15.0)
Immature Granulocytes: 1 %
Lymphocytes Relative: 31 %
Lymphs Abs: 2.1 10*3/uL (ref 0.7–4.0)
MCH: 29.8 pg (ref 26.0–34.0)
MCHC: 33.1 g/dL (ref 30.0–36.0)
MCV: 90.1 fL (ref 80.0–100.0)
Monocytes Absolute: 0.6 10*3/uL (ref 0.1–1.0)
Monocytes Relative: 8 %
Neutro Abs: 4 10*3/uL (ref 1.7–7.7)
Neutrophils Relative %: 59 %
Platelets: 322 10*3/uL (ref 150–400)
RBC: 4.46 MIL/uL (ref 3.87–5.11)
RDW: 13.9 % (ref 11.5–15.5)
WBC: 6.7 10*3/uL (ref 4.0–10.5)
nRBC: 0 % (ref 0.0–0.2)

## 2021-04-13 LAB — BASIC METABOLIC PANEL
Anion gap: 11 (ref 5–15)
BUN: 21 mg/dL — ABNORMAL HIGH (ref 6–20)
CO2: 27 mmol/L (ref 22–32)
Calcium: 8.9 mg/dL (ref 8.9–10.3)
Chloride: 99 mmol/L (ref 98–111)
Creatinine, Ser: 0.97 mg/dL (ref 0.44–1.00)
GFR, Estimated: >60 mL/min (ref >60)
Glucose, Bld: 102 mg/dL — ABNORMAL HIGH (ref 70–99)
Potassium: 3.8 mmol/L (ref 3.5–5.1)
Sodium: 137 mmol/L (ref 135–145)

## 2021-04-13 LAB — CBG MONITORING, ED: Glucose-Capillary: 119 mg/dL — ABNORMAL HIGH (ref 70–99)

## 2021-04-13 NOTE — Discharge Instructions (Addendum)
Your blood work did not show any significant abnormality.  I did notice on my exam that you have a soft tissue mass at the top of your mouth.  This is something that if it has not been followed before should be looked at by your family doctor or your dentist.  Please return for worsening symptoms, fever.

## 2021-04-13 NOTE — ED Triage Notes (Signed)
Pt states HA that started this morning, reports being sweaty and nauseated, states prediabetic and thinks maybe her Blood sugar is hight, states feet tingling and dizziness

## 2021-04-13 NOTE — ED Provider Notes (Signed)
[facility] EMERGENCY DEPARTMENT Provider Note   CSN: [id number] Arrival date & time: 04/13/21  1704     History Chief Complaint  Patient presents with   Headache   Nausea    Theresa Miller is a 50 y.o. female.  50 yo F with a chief complaint of feeling weak and tired and having a mild headache.  She feels like its occurred because she started taking a new medication for hot flashes.  She looked up the possible side effects and she feels like she has all of them.  She is felt a little bit dizzy.  Has a little bit of a diffuse headache.  I personally saw her yesterday for left-sided neck swelling.  She thinks that that is mildly better after starting amoxicillin.  Denies fevers or chills.  Denies head injury denies one-sided numbness or weakness denies difficulty with speech or swallowing.  The history is provided by the patient.  Headache Pain location:  Generalized Associated symptoms: congestion, dizziness and fatigue   Associated symptoms: no fever, no myalgias, no nausea and no vomiting   Illness Severity:  Moderate Onset quality:  Gradual Duration:  1 day Timing:  Constant Progression:  Worsening Chronicity:  New Associated symptoms: congestion, fatigue and headaches   Associated symptoms: no chest pain, no fever, no myalgias, no nausea, no rhinorrhea, no shortness of breath, no vomiting and no wheezing       Past Medical History:  Diagnosis Date   Arthritis    Chronic pain    COVID-19    Hypertension     There are no problems to display for this patient.   Past Surgical History:  Procedure Laterality Date   ABDOMINAL HYSTERECTOMY       OB History   No obstetric history on file.     History reviewed. No pertinent family history.  Social History   Tobacco Use   Smoking status: Every Day    Packs/day: 1.00    Pack years: 0.00    Types: Cigarettes   Smokeless tobacco: Never  Vaping Use   Vaping Use: Never used  Substance Use Topics    Alcohol use: Not Currently   Drug use: No    Home Medications Prior to Admission medications   Medication Sig Start Date End Date Taking? Authorizing Provider  allopurinol (ZYLOPRIM) 100 MG tablet Take 100 mg by mouth daily.    [provider]  amoxicillin (AMOXIL) 500 MG tablet Take 2 tablets (1,000 mg total) by mouth 2 (two) times daily. 04/12/21   Melene Plan, DO  benzonatate (TESSALON) 100 MG capsule Take 1 capsule (100 mg total) by mouth every 8 (eight) hours. 11/17/19   Hyman Hopes, Margaux, PA-C  cetirizine (ZYRTEC) 10 MG tablet Take 10 mg by mouth daily.    [provider]  cyclobenzaprine (FLEXERIL) 10 MG tablet Take 10 mg by mouth 3 (three) times daily as needed for muscle spasms.    [provider]  diclofenac (VOLTAREN) 75 MG EC tablet Take 75 mg by mouth 2 (two) times daily.    [provider]  esomeprazole (NEXIUM) 40 MG capsule Take 40 mg by mouth daily at 12 noon.    [provider]  fluticasone (FLONASE) 50 MCG/ACT nasal spray Place 2 sprays into both nostrils daily.    [provider]  hydrochlorothiazide (HYDRODIURIL) 25 MG tablet Take 25 mg by mouth daily.    [provider]  meclizine (ANTIVERT) 12.5 MG tablet Take 1 tablet (12.5 mg  total) by mouth 3 (three) times daily as needed for dizziness. 07/28/19   Long, Arlyss Repress, MD  meloxicam (MOBIC) 15 MG tablet Take 15 mg by mouth daily.    [provider]  ondansetron (ZOFRAN ODT) 4 MG disintegrating tablet Take 1 tablet (4 mg total) by mouth every 8 (eight) hours as needed for nausea or vomiting. 01/01/21   Farrel Gordon, PA-C  ondansetron (ZOFRAN) 4 MG tablet Take 1 tablet (4 mg total) by mouth every 6 (six) hours. 11/17/19   Hyman Hopes, Margaux, PA-C  oxycodone (OXY-IR) 5 MG capsule Take 5 mg by mouth 3 (three) times daily as needed.    [provider]  pantoprazole (PROTONIX) 40 MG tablet Take 1 tablet (40 mg total) by mouth daily. 01/01/21 01/31/21  Farrel Gordon, PA-C  potassium chloride (KLOR-CON) 10 MEQ tablet Take 1 tablet (10 mEq total) by mouth daily for 5 days. 11/17/19 11/22/19  Hyman Hopes, Margaux, PA-C  sucralfate (CARAFATE) 1 GM/10ML suspension Take 10 mLs (1 g total) by mouth 4 (four) times daily -  with meals and at bedtime. 01/01/21   Farrel Gordon, PA-C  valsartan (DIOVAN) 80 MG tablet Take 80 mg by mouth daily.    [provider]    Allergies    Patient has no known allergies.  Review of Systems   Review of Systems  Constitutional:  Positive for fatigue. Negative for chills and fever.  HENT:  Positive for congestion. Negative for rhinorrhea.   Eyes:  Negative for redness and visual disturbance.  Respiratory:  Negative for shortness of breath and wheezing.   Cardiovascular:  Negative for chest pain and palpitations.  Gastrointestinal:  Negative for nausea and vomiting.  Genitourinary:  Negative for dysuria and urgency.  Musculoskeletal:  Negative for arthralgias and myalgias.  Skin:  Negative for pallor and wound.  Neurological:  Positive for dizziness and headaches.   Physical Exam Updated Vital Signs BP (!) 133/95 (BP Location: Right Arm)   Pulse 78   Temp 98.2 F (36.8 C) (Oral)   Resp 18   Ht 5\' 4"  (1.626 m)   Wt 106.6 kg   SpO2 100%   BMI 40.34 kg/m   Physical Exam Vitals and nursing note reviewed.  Constitutional:      General: She is not in acute distress.    Appearance: She is well-developed. She is not diaphoretic.  HENT:     Head: Normocephalic and atraumatic.  Eyes:     Pupils: Pupils are equal, round, and reactive to light.  Cardiovascular:     Rate and Rhythm: Normal rate and regular rhythm.     Heart sounds: No murmur heard.   No friction rub. No gallop.  Pulmonary:     Effort: Pulmonary effort is normal.     Breath sounds: No wheezing or rales.  Abdominal:     General: There is no distension.     Palpations: Abdomen is soft.     Tenderness: There is no abdominal tenderness.   Musculoskeletal:        General: No tenderness.     Cervical back: Normal range of motion and neck supple.  Skin:    General: Skin is warm and dry.  Neurological:     Mental Status: She is alert and oriented to person, place, and time.     GCS: GCS eye subscore is 4. GCS verbal subscore is 5. GCS motor subscore is 6.     Cranial Nerves: Cranial nerves are intact.  Sensory: Sensation is intact.     Motor: Motor function is intact.     Coordination: Coordination is intact.     Comments: Ambulates without difficulty, benign neurologic exam.   Psychiatric:        Behavior: Behavior normal.    ED Results / Procedures / Treatments   Labs (all labs ordered are listed, but only abnormal results are displayed) Labs Reviewed  BASIC METABOLIC PANEL - Abnormal; Notable for the following components:      Result Value   Glucose, Bld 102 (*)    BUN 21 (*)    All other components within normal limits  CBG MONITORING, ED - Abnormal; Notable for the following components:   Glucose-Capillary 119 (*)    All other components within normal limits  CBC WITH DIFFERENTIAL/PLATELET    EKG None  Radiology No results found.  Procedures Procedures   Medications Ordered in ED Medications - No data to display  ED Course  I have reviewed the triage vital signs and the nursing notes.  Pertinent labs & imaging results that were available during my care of the patient were reviewed by me and considered in my medical decision making (see chart for details).    MDM Rules/Calculators/A&P                          50 yo F with a cc of dizziness, weakness. ? Viral syndrome, seen here yesterday by me for left-sided neck swelling.  She feels like that is improved somewhat, thinks that her symptoms now are side effect of the medication that she started recently.  Timeline wise it seems less likely to be that.  Will obtain screening blood work.  PCP follow-up.  Lab work is resulted and is  unremarkable.  11:13 PM:  I have discussed the diagnosis/risks/treatment options with the patient and believe the pt to be eligible for discharge home to follow-up with PCP. We also discussed returning to the ED immediately if new or worsening sx occur. We discussed the sx which are most concerning (e.g., sudden worsening pain, fever, inability to tolerate by mouth) that necessitate immediate return. Medications administered to the patient during their visit and any new prescriptions provided to the patient are listed below.  Medications given during this visit Medications - No data to display   The patient appears reasonably screen and/or stabilized for discharge and I doubt any other medical condition or other [facility] requiring further screening, evaluation, or treatment in the ED at this time prior to discharge.    Final Clinical Impression(s) / ED Diagnoses Final diagnoses:  Dizziness    Rx / DC Orders ED Discharge Orders     None        Melene Plan, DO 04/13/21 2313

## 2021-05-05 ENCOUNTER — Emergency Department (HOSPITAL_BASED_OUTPATIENT_CLINIC_OR_DEPARTMENT_OTHER): Payer: Medicaid Other

## 2021-05-05 ENCOUNTER — Other Ambulatory Visit: Payer: Self-pay

## 2021-05-05 ENCOUNTER — Emergency Department (HOSPITAL_BASED_OUTPATIENT_CLINIC_OR_DEPARTMENT_OTHER)
Admission: EM | Admit: 2021-05-05 | Discharge: 2021-05-05 | Disposition: A | Discharge status: Home / Self Care | Payer: Medicaid Other | Attending: Emergency Medicine | Admitting: Emergency Medicine

## 2021-05-05 ENCOUNTER — Encounter (HOSPITAL_BASED_OUTPATIENT_CLINIC_OR_DEPARTMENT_OTHER): Payer: Self-pay

## 2021-05-05 DIAGNOSIS — I10 Essential (primary) hypertension: Secondary | ICD-10-CM | POA: Insufficient documentation

## 2021-05-05 DIAGNOSIS — G43809 Other migraine, not intractable, without status migrainosus: Secondary | ICD-10-CM | POA: Insufficient documentation

## 2021-05-05 DIAGNOSIS — Z8616 Personal history of COVID-19: Secondary | ICD-10-CM | POA: Diagnosis not present

## 2021-05-05 DIAGNOSIS — R519 Headache, unspecified: Secondary | ICD-10-CM | POA: Diagnosis present

## 2021-05-05 DIAGNOSIS — Z87891 Personal history of nicotine dependence: Secondary | ICD-10-CM | POA: Diagnosis not present

## 2021-05-05 DIAGNOSIS — Z79899 Other long term (current) drug therapy: Secondary | ICD-10-CM | POA: Diagnosis not present

## 2021-05-05 DIAGNOSIS — H9202 Otalgia, left ear: Secondary | ICD-10-CM | POA: Diagnosis not present

## 2021-05-05 DIAGNOSIS — E119 Type 2 diabetes mellitus without complications: Secondary | ICD-10-CM | POA: Diagnosis not present

## 2021-05-05 LAB — CBG MONITORING, ED: Glucose-Capillary: 106 mg/dL — ABNORMAL HIGH (ref 70–99)

## 2021-05-05 MED ORDER — SODIUM CHLORIDE 0.9 % IV BOLUS
1000.0000 mL | Freq: Once | INTRAVENOUS | Status: AC
  Administered 2021-05-05: 1000 mL via INTRAVENOUS

## 2021-05-05 MED ORDER — METOCLOPRAMIDE HCL 5 MG/ML IJ SOLN
10.0000 mg | Freq: Once | INTRAMUSCULAR | Status: AC
  Administered 2021-05-05: 10 mg via INTRAVENOUS
  Filled 2021-05-05: qty 2

## 2021-05-05 MED ORDER — KETOROLAC TROMETHAMINE 30 MG/ML IJ SOLN
30.0000 mg | Freq: Once | INTRAMUSCULAR | Status: AC
  Administered 2021-05-05: 30 mg via INTRAMUSCULAR
  Filled 2021-05-05: qty 1

## 2021-05-05 NOTE — ED Provider Notes (Signed)
[facility] EMERGENCY DEPARTMENT Provider Note   CSN: [id number] Arrival date & time: 05/05/21  1009     History Chief Complaint  Patient presents with   Otalgia   Headache    Theresa Miller is a 50 y.o. female.  Patient presents with headache that has been ongoing for 3 to 4 months.  It occurs every day, associated with nausea.  She has been seen in the ED and by her primary care doctor for this.  She has not had any relief with Tylenol or with allergy medicine.  Patient headache lasts most of the day, there is no photophobia or phonophobia.  She is worried that something is attacking her brain.  There is also associated left ear pain and blurry vision. Patient denies any recent trauma to the head.  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  She has been trying lozenges but that has not helped with the pain.   Otalgia Associated symptoms: headaches   Headache Associated symptoms: ear pain       Past Medical History:  Diagnosis Date   Arthritis    Chronic pain    COVID-19    Hypertension     There are no problems to display for this patient.   Past Surgical History:  Procedure Laterality Date   ABDOMINAL HYSTERECTOMY       OB History   No obstetric history on file.     No family history on file.  Social History   Tobacco Use   Smoking status: Former    Packs/day: 1.00    Types: Cigarettes   Smokeless tobacco: Never  Vaping Use   Vaping Use: Every day  Substance Use Topics   Alcohol use: Not Currently   Drug use: No    Home Medications Prior to Admission medications   Medication Sig Start Date End Date Taking? Authorizing Provider  allopurinol (ZYLOPRIM) 100 MG tablet Take 100 mg by mouth daily.    [provider]  amoxicillin (AMOXIL) 500 MG tablet Take 2 tablets (1,000 mg total) by mouth 2 (two) times daily. 04/12/21   Melene Plan, DO  benzonatate (TESSALON) 100 MG capsule Take 1 capsule (100 mg total) by mouth  every 8 (eight) hours. 11/17/19   Hyman Hopes, Margaux, PA-C  cetirizine (ZYRTEC) 10 MG tablet Take 10 mg by mouth daily.    [provider]  cyclobenzaprine (FLEXERIL) 10 MG tablet Take 10 mg by mouth 3 (three) times daily as needed for muscle spasms.    [provider]  diclofenac (VOLTAREN) 75 MG EC tablet Take 75 mg by mouth 2 (two) times daily.    [provider]  esomeprazole (NEXIUM) 40 MG capsule Take 40 mg by mouth daily at 12 noon.    [provider]  fluticasone (FLONASE) 50 MCG/ACT nasal spray Place 2 sprays into both nostrils daily.    [provider]  hydrochlorothiazide (HYDRODIURIL) 25 MG tablet Take 25 mg by mouth daily.    [provider]  meclizine (ANTIVERT) 12.5 MG tablet Take 1 tablet (12.5 mg total) by mouth 3 (three) times daily as needed for dizziness. 07/28/19   Long, Arlyss Repress, MD  meloxicam (MOBIC) 15 MG tablet Take 15 mg by mouth daily.    [provider]  ondansetron (ZOFRAN ODT) 4 MG disintegrating tablet Take 1 tablet (4 mg total) by mouth every 8 (eight) hours as needed for nausea or vomiting. 01/01/21   Farrel Gordon, PA-C  ondansetron [facility]) 4  MG tablet Take 1 tablet (4 mg total) by mouth every 6 (six) hours. 11/17/19   Hyman Hopes, Margaux, PA-C  oxycodone (OXY-IR) 5 MG capsule Take 5 mg by mouth 3 (three) times daily as needed.    [provider]  pantoprazole (PROTONIX) 40 MG tablet Take 1 tablet (40 mg total) by mouth daily. 01/01/21 01/31/21  Farrel Gordon, PA-C  potassium chloride (KLOR-CON) 10 MEQ tablet Take 1 tablet (10 mEq total) by mouth daily for 5 days. 11/17/19 11/22/19  Hyman Hopes, Margaux, PA-C  sucralfate (CARAFATE) 1 GM/10ML suspension Take 10 mLs (1 g total) by mouth 4 (four) times daily -  with meals and at bedtime. 01/01/21   Farrel Gordon, PA-C  valsartan (DIOVAN) 80 MG tablet Take 80 mg by mouth daily.    [provider]    Allergies    Patient has no known allergies.  Review of  Systems   Review of Systems  HENT:  Positive for ear pain.   Neurological:  Positive for headaches.   Physical Exam Updated Vital Signs BP 112/82   Pulse 80   Temp 98.2 F (36.8 C) (Oral)   Resp 18   Ht 5\' 4"  (1.626 m)   Wt 106 kg   SpO2 99%   BMI 40.11 kg/m   Physical Exam Vitals and nursing note reviewed. Exam conducted with a chaperone present.  Constitutional:      Appearance: Normal appearance.  HENT:     Head: Normocephalic and atraumatic.     Comments: TM is pearly gray with visible landmarks.  There is no exudate or erythema.  External ear canal is clear, there is no tragal tenderness Eyes:     General: No scleral icterus.       Right eye: No discharge.        Left eye: No discharge.     Extraocular Movements: Extraocular movements intact.     Pupils: Pupils are equal, round, and reactive to light.  Cardiovascular:     Rate and Rhythm: Normal rate and regular rhythm.     Pulses: Normal pulses.     Heart sounds: Normal heart sounds. No murmur heard.   No friction rub. No gallop.  Pulmonary:     Effort: Pulmonary effort is normal. No respiratory distress.     Breath sounds: Normal breath sounds.  Abdominal:     General: Abdomen is flat. Bowel sounds are normal. There is no distension.     Palpations: Abdomen is soft.     Tenderness: There is no abdominal tenderness.  Skin:    General: Skin is warm and dry.     Coloration: Skin is not jaundiced.  Neurological:     Mental Status: She is alert. Mental status is at baseline.     Coordination: Coordination normal.     Comments: Cranial nerves III through XII grossly intact.  Grip strength equal bilaterally.    ED Results / Procedures / Treatments   Labs (all labs ordered are listed, but only abnormal results are displayed) Labs Reviewed  CBG MONITORING, ED - Abnormal; Notable for the following components:      Result Value   Glucose-Capillary 106 (*)    All other components within normal limits     EKG None  Radiology CT Head Wo Contrast  Result Date: 05/05/2021 CLINICAL DATA:  Headache, migraines EXAM: CT HEAD WITHOUT CONTRAST TECHNIQUE: Contiguous axial images were obtained from the base of the skull through the vertex without intravenous contrast. COMPARISON:  None.  FINDINGS: Brain: No evidence of acute infarction, hemorrhage, hydrocephalus, extra-axial collection or mass lesion/mass effect. Vascular: No hyperdense vessel or unexpected calcification. Skull: No osseous abnormality. Sinuses/Orbits: Visualized paranasal sinuses are clear. Visualized mastoid sinuses are clear. Visualized orbits demonstrate no focal abnormality. Other: None IMPRESSION: No acute intracranial pathology. Electronically Signed   By: Elige Ko   On: 05/05/2021 12:33    Procedures Procedures   Medications Ordered in ED Medications  ketorolac (TORADOL) 30 MG/ML injection 30 mg (30 mg Intramuscular Given 05/05/21 1157)  metoCLOPramide (REGLAN) injection 10 mg (10 mg Intravenous Given 05/05/21 1155)  sodium chloride 0.9 % bolus 1,000 mL (0 mLs Intravenous Stopped 05/05/21 1303)    ED Course  I have reviewed the triage vital signs and the nursing notes.  Pertinent labs & imaging results that were available during my care of the patient were reviewed by me and considered in my medical decision making (see chart for details).     MDM Rules/Calculators/A&P                          Patient has stable vitals, she reports with headache x3 to 4 months.  She has been seen in the ED for this complaint as well as followed up by primary care doctor early this week.  Given the failure of alternative medications, I will scan her head to make sure we are not missing a bleed.  We will also check her sugars given she is diabetic.  Her ear looks great on exam, I do not suspect any AOM or otitis externa.  There are no neurodeficits on exam.  Overall, very reassuring.  We will give a shot of medication and see if that  helps resolve the migraine.  Migraine resolved with migraine cocktail.  Discharge plan discussed with patient as well as follow-up return precautions.  Patient verbalized understanding and agreement with the plan.  I have also advised her to follow-up with her primary care doctor later this week for further evaluation.  At this time I do not suspect any emergent disease process. Final Clinical Impression(s) / ED Diagnoses Final diagnoses:  Other migraine without status migrainosus, not intractable    Rx / DC Orders ED Discharge Orders     None        Theron Arista, Cordelia Poche 05/05/21 1917    Milagros Loll, MD 05/07/21 1524

## 2021-05-05 NOTE — ED Triage Notes (Signed)
Pt c/o headache and ear ache for a few weeks with ear pain. States "feels like something is wrong with her brain". Has been seen here and at PCP recently for same.

## 2021-05-05 NOTE — Discharge Instructions (Addendum)
You are seen today in the ED for headache and neck pain.  Your work-up was very reassuring.  Please continue to take ibuprofen and Tylenol as needed for the headache pain.  I also want you to follow-up with your primary care doctor in the next week if the pain persist.  Based on your work-up today, I do not have a high suspicion that anything emergent is going on.  However, if things change or worsen please return back to the ED for further evaluation.

## 2021-07-01 ENCOUNTER — Ambulatory Visit: Payer: Medicaid Other | Admitting: Neurology

## 2021-11-18 ENCOUNTER — Ambulatory Visit: Payer: Medicaid Other | Admitting: Medical

## 2023-06-16 IMAGING — CT CT HEAD W/O CM
3 series · 16 of 47 positions shown, 19 images · non-contrast
Comparison: None.

CLINICAL DATA: Headache, migraines

EXAM:
CT HEAD WITHOUT CONTRAST
TECHNIQUE: Contiguous axial images were obtained from the base of the skull
through the vertex without intravenous contrast.

[Series 2: head wo · axial · 0.41mm/px · z∈[+1333,+1463]mm · 10 of 32 slices shown, 13 images]
[im 3/32  brain]
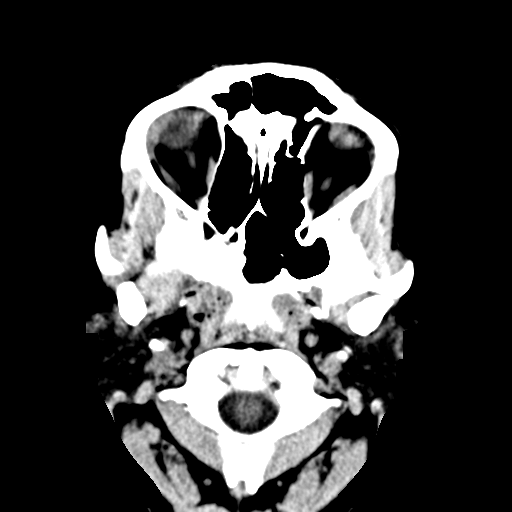
[im 3/32  bone]
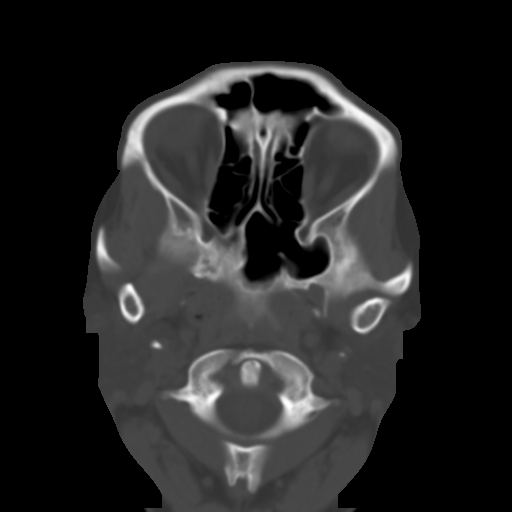
[im 6/32  brain]
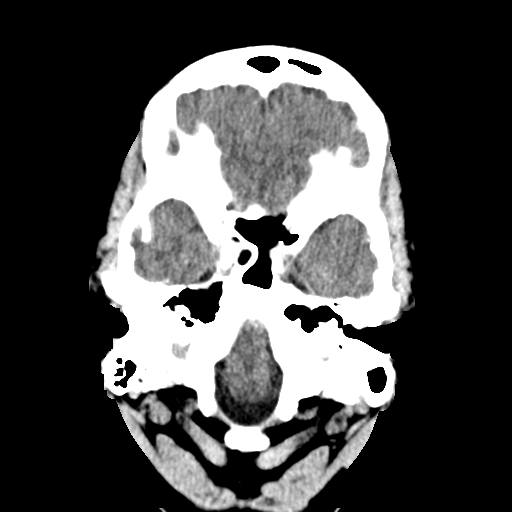
[im 9/32  brain]
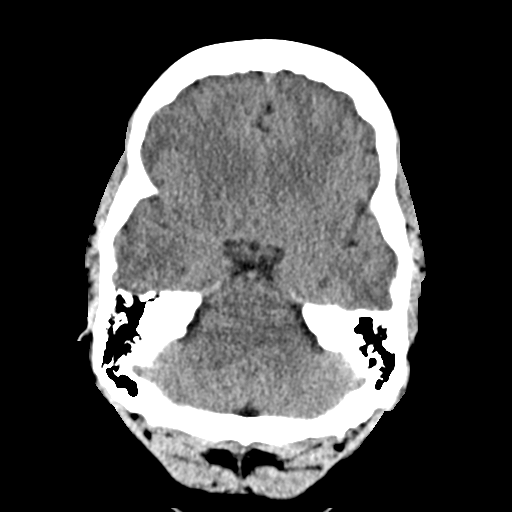
[im 11/32  brain]
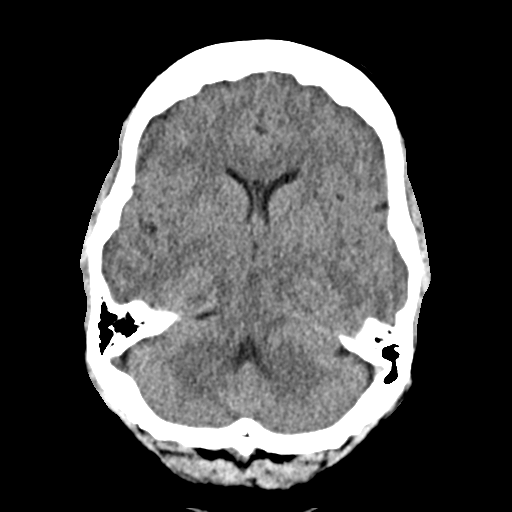
[im 14/32  brain]
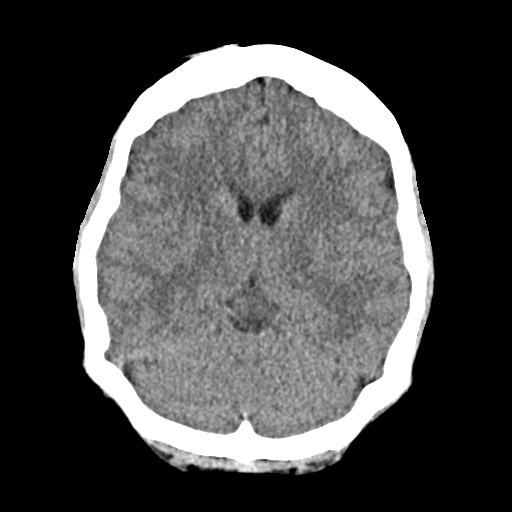
[im 14/32  bone]
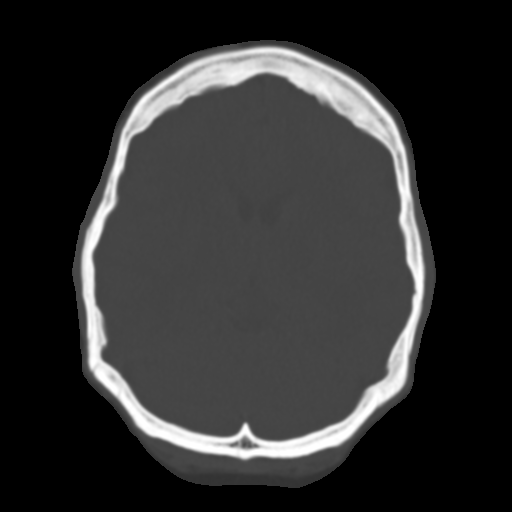
[im 18/32  brain]
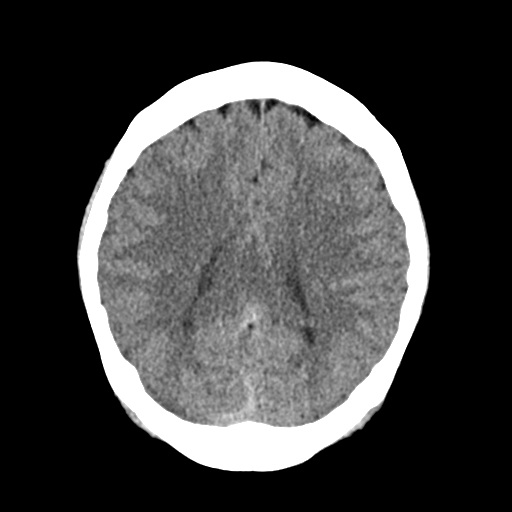
[im 21/32  brain]
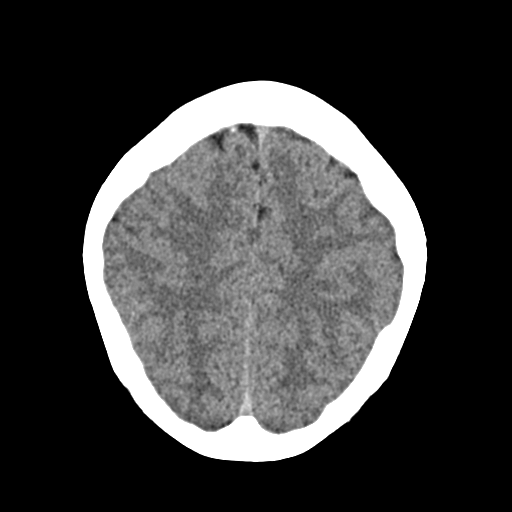
[im 24/32  brain]
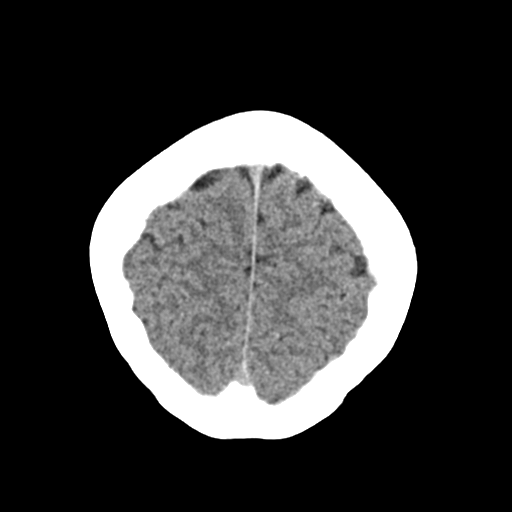
[im 26/32  brain]
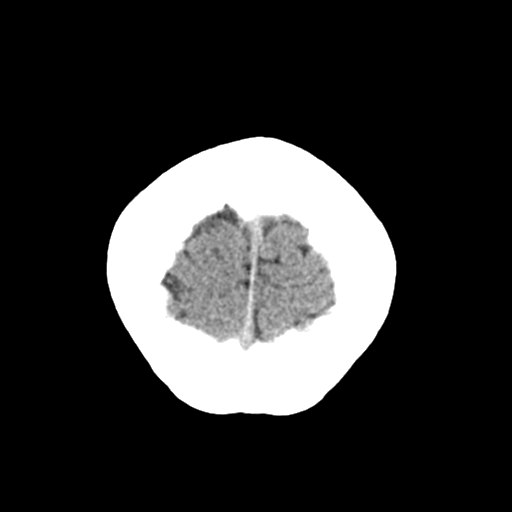
[im 26/32  bone]
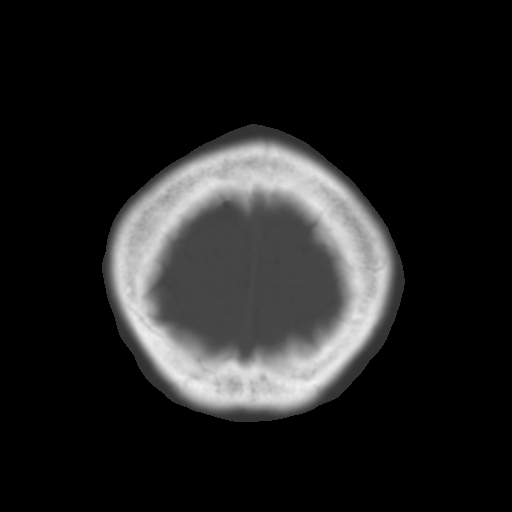
[im 29/32  brain]
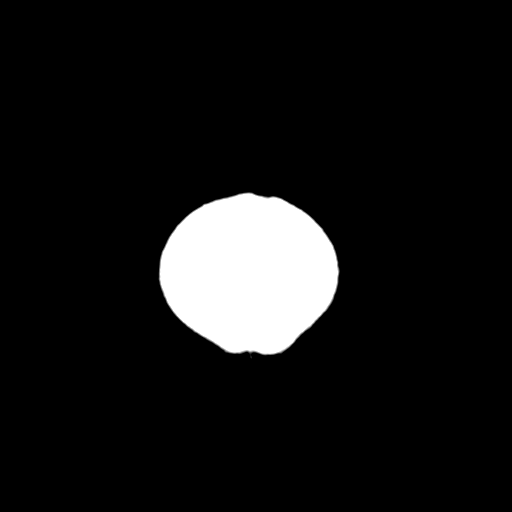

[Series 4: coronal soft · coronal · 0.33mm/px · 3 of 70 slices shown]
[im 24/70  brain]
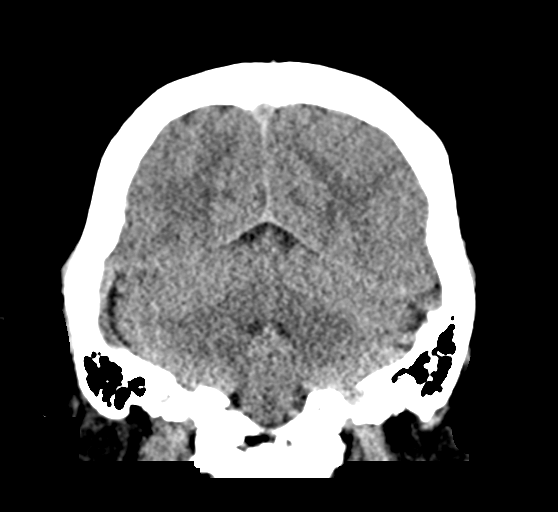
[im 31/70  brain]
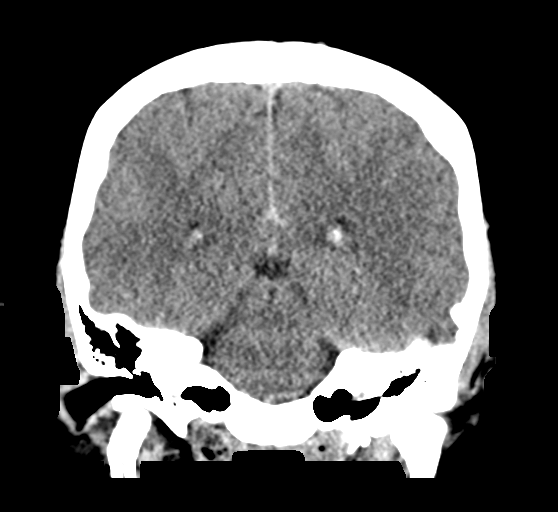
[im 39/70  brain]
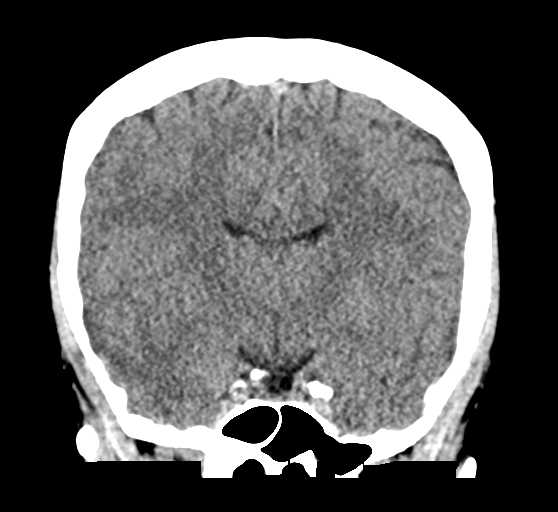

[Series 5: sag soft · sagittal · 0.32mm/px · 3 of 56 slices shown]
[im 19/56  brain]
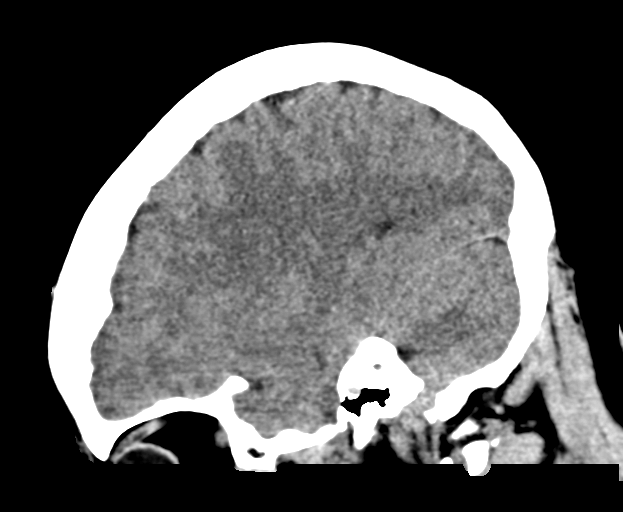
[im 28/56  brain]
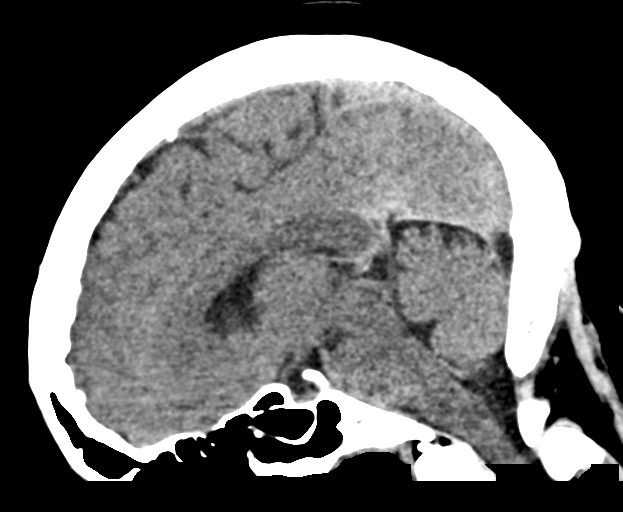
[im 37/56  brain]
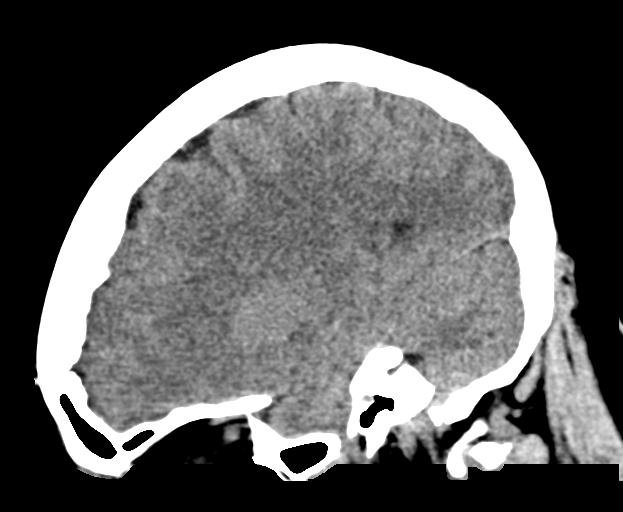

[16 of 47 positions shown; findings below may reference images not displayed]

FINDINGS: Brain: No evidence of acute infarction, hemorrhage, hydrocephalus,
extra-axial collection or mass lesion/mass effect.

Vascular: No hyperdense vessel or unexpected calcification.

Skull: No osseous abnormality.

Sinuses/Orbits: Visualized paranasal sinuses are clear. Visualized
mastoid sinuses are clear. Visualized orbits demonstrate no focal
abnormality.

Other: None
IMPRESSION: No acute intracranial pathology.
